# Patient Record
Sex: Female | Born: 1985 | Hispanic: Yes | Marital: Single | Smoking: Never smoker
Health system: Southern US, Community
[De-identification: ages and names within clinical notes are randomized; demographics above are authoritative.]

## PROBLEM LIST (undated history)

## (undated) ENCOUNTER — Emergency Department: Admission: EM | Payer: Self-pay

## (undated) DIAGNOSIS — D499 Neoplasm of unspecified behavior of unspecified site: Secondary | ICD-10-CM

## (undated) DIAGNOSIS — E039 Hypothyroidism, unspecified: Secondary | ICD-10-CM

## (undated) DIAGNOSIS — E079 Disorder of thyroid, unspecified: Secondary | ICD-10-CM

## (undated) HISTORY — DX: Disorder of thyroid, unspecified: E07.9

## (undated) HISTORY — DX: Neoplasm of unspecified behavior of unspecified site: D49.9

---

## 2019-03-22 ENCOUNTER — Ambulatory Visit (LOCAL_COMMUNITY_HEALTH_CENTER): Payer: Self-pay

## 2019-03-22 ENCOUNTER — Other Ambulatory Visit: Payer: Self-pay

## 2019-03-22 VITALS — BP 115/78 | Ht 61.02 in | Wt 134.0 lb

## 2019-03-22 DIAGNOSIS — Z3201 Encounter for pregnancy test, result positive: Secondary | ICD-10-CM

## 2019-03-22 LAB — PREGNANCY, URINE: Preg Test, Ur: POSITIVE — AB

## 2019-03-22 NOTE — Progress Notes (Signed)
No [facility]. Already had PNV. Pt unsure of where she wants to go for prenatal care; declines preadmit at this time. Pt to check with her prescribing Dr about thyroid medicine.

## 2019-03-28 ENCOUNTER — Emergency Department
Admission: EM | Admit: 2019-03-28 | Discharge: 2019-03-28 | Disposition: A | Discharge status: Home / Self Care | Payer: Self-pay | Attending: Student | Admitting: Student

## 2019-03-28 ENCOUNTER — Emergency Department: Payer: Self-pay

## 2019-03-28 ENCOUNTER — Other Ambulatory Visit: Payer: Self-pay

## 2019-03-28 DIAGNOSIS — N898 Other specified noninflammatory disorders of vagina: Secondary | ICD-10-CM | POA: Insufficient documentation

## 2019-03-28 DIAGNOSIS — O26891 Other specified pregnancy related conditions, first trimester: Secondary | ICD-10-CM | POA: Insufficient documentation

## 2019-03-28 DIAGNOSIS — Z79899 Other long term (current) drug therapy: Secondary | ICD-10-CM | POA: Insufficient documentation

## 2019-03-28 DIAGNOSIS — Z3A01 Less than 8 weeks gestation of pregnancy: Secondary | ICD-10-CM | POA: Insufficient documentation

## 2019-03-28 LAB — CBC WITH DIFFERENTIAL/PLATELET
Abs Immature Granulocytes: 0.02 10*3/uL (ref 0.00–0.07)
Basophils Absolute: 0 10*3/uL (ref 0.0–0.1)
Basophils Relative: 0 %
Eosinophils Absolute: 0.1 10*3/uL (ref 0.0–0.5)
Eosinophils Relative: 2 %
HCT: 35.6 % — ABNORMAL LOW (ref 36.0–46.0)
Hemoglobin: 12.4 g/dL (ref 12.0–15.0)
Immature Granulocytes: 0 %
Lymphocytes Relative: 32 %
Lymphs Abs: 2.2 10*3/uL (ref 0.7–4.0)
MCH: 30.9 pg (ref 26.0–34.0)
MCHC: 34.8 g/dL (ref 30.0–36.0)
MCV: 88.8 fL (ref 80.0–100.0)
Monocytes Absolute: 0.4 10*3/uL (ref 0.1–1.0)
Monocytes Relative: 6 %
Neutro Abs: 4 10*3/uL (ref 1.7–7.7)
Neutrophils Relative %: 60 %
Platelets: 239 10*3/uL (ref 150–400)
RBC: 4.01 MIL/uL (ref 3.87–5.11)
RDW: 11.5 % (ref 11.5–15.5)
WBC: 6.8 10*3/uL (ref 4.0–10.5)
nRBC: 0 % (ref 0.0–0.2)

## 2019-03-28 LAB — HCG, QUANTITATIVE, PREGNANCY: hCG, Beta Chain, Quant, S: 16151 m[IU]/mL — ABNORMAL HIGH (ref <5)

## 2019-03-28 LAB — COMPREHENSIVE METABOLIC PANEL
ALT: 14 U/L (ref 0–44)
AST: 18 U/L (ref 15–41)
Albumin: 4.4 g/dL (ref 3.5–5.0)
Alkaline Phosphatase: 46 U/L (ref 38–126)
Anion gap: 7 (ref 5–15)
BUN: 7 mg/dL (ref 6–20)
CO2: 24 mmol/L (ref 22–32)
Calcium: 9.4 mg/dL (ref 8.9–10.3)
Chloride: 106 mmol/L (ref 98–111)
Creatinine, Ser: 0.55 mg/dL (ref 0.44–1.00)
GFR calc Af Amer: >60 mL/min (ref >60)
GFR calc non Af Amer: >60 mL/min (ref >60)
Glucose, Bld: 117 mg/dL — ABNORMAL HIGH (ref 70–99)
Potassium: 3.4 mmol/L — ABNORMAL LOW (ref 3.5–5.1)
Sodium: 137 mmol/L (ref 135–145)
Total Bilirubin: 0.7 mg/dL (ref 0.3–1.2)
Total Protein: 7.9 g/dL (ref 6.5–8.1)

## 2019-03-28 LAB — URINALYSIS, COMPLETE (UACMP) WITH MICROSCOPIC
Bacteria, UA: NONE SEEN
Bilirubin Urine: NEGATIVE
Glucose, UA: NEGATIVE mg/dL
Hgb urine dipstick: NEGATIVE
Ketones, ur: NEGATIVE mg/dL
Leukocytes,Ua: NEGATIVE
Nitrite: NEGATIVE
Protein, ur: NEGATIVE mg/dL
Specific Gravity, Urine: 1.002 — ABNORMAL LOW (ref 1.005–1.030)
pH: 7 (ref 5.0–8.0)

## 2019-03-28 LAB — WET PREP, GENITAL
Clue Cells Wet Prep HPF POC: NONE SEEN
Sperm: NONE SEEN
Trich, Wet Prep: NONE SEEN
Yeast Wet Prep HPF POC: NONE SEEN

## 2019-03-28 LAB — TYPE AND SCREEN
ABO/RH(D): A POS
Antibody Screen: NEGATIVE

## 2019-03-28 NOTE — ED Notes (Signed)
Paper copy of discharge papers signed

## 2019-03-28 NOTE — ED Notes (Signed)
ED Provider at bedside. 

## 2019-03-28 NOTE — ED Triage Notes (Signed)
Pt states she is about 2 months pregnant and for the past 7 days had a odorless brown vaginal discharge. Denies and pain.

## 2019-03-28 NOTE — ED Provider Notes (Signed)
[facility] Emergency Department Provider Note  ____________________________________________   First MD Initiated Contact with Patient 03/28/19 1043     (approximate)  I have reviewed the triage vital signs and the nursing notes.  History  Chief Complaint Vaginal Bleeding    HPI Linda Dorsey is a 33 y.o. female with history of microprolactinoma, hypothyroidism, G1P0 at [redacted]w[redacted]d by LMP (02/01/19) who presents to the emergency department for vaginal discharge.  Patient reports over the last 8 days she has had a very small amount of brownish vaginal discharge.  She only really notices this when wiping.  She denies any bright red bleeding or blood clots.  She reports minimal, if any lower abdominal discomfort, not even to the severity of menstrual cramps.  She reports some burning urination over the same time period as well.  She has not had an ultrasound this pregnancy thus far.  This is a planned pregnancy.   Past Medical Hx Past Medical History:  Diagnosis Date  . Thyroid disease    hypothyroid  . Tumor    brain tumor, noncancerous    Problem List There are no active problems to display for this patient.   Past Surgical Hx History reviewed. No pertinent surgical history.  Medications Prior to Admission medications   Medication Sig Start Date End Date Taking? Authorizing Provider  levothyroxine (SYNTHROID) 25 MCG tablet Take 25 mcg by mouth daily before breakfast. Correct name for medication is Eutirox (Levotiroxina sodica 25 mcg) - received medicine in [place]    [provider]  Prenatal Vit-Fe Fumarate-FA (PRENATAL VITAMINS PO) Take 2 tablets by mouth. 2 gummies per day    [provider]    Allergies Patient has no known allergies.  Family Hx No family history on file.  Social Hx Social History   Tobacco Use  . Smoking status: Never Smoker  . Smokeless tobacco: Never Used  Substance Use Topics  . Alcohol use: Not  Currently  . Drug use: Not Currently     Review of Systems  Constitutional: Negative for fever, chills. Eyes: Negative for visual changes. ENT: Negative for sore throat. Cardiovascular: Negative for chest pain. Respiratory: Negative for shortness of breath. Gastrointestinal: Negative for nausea, vomiting.  Genitourinary: + for dysuria, vaginal discharge. Musculoskeletal: Negative for leg swelling. Skin: Negative for rash. Neurological: Negative for for headaches.   Physical Exam  Vital Signs: ED Triage Vitals  Enc Vitals Group     BP 03/28/19 1019 (!) 108/54     Pulse Rate 03/28/19 1019 94     Resp 03/28/19 1019 16     Temp 03/28/19 1019 98.5 F (36.9 C)     Temp Source 03/28/19 1019 Oral     SpO2 03/28/19 1019 97 %     Weight --      Height --      Head Circumference --      Peak Flow --      Pain Score 03/28/19 1024 0     Pain Loc --      Pain Edu? --      Excl. in [place]? --     Constitutional: Alert and oriented.  Head: Normocephalic. Atraumatic. Eyes: Conjunctivae clear. Sclera anicteric. Nose: No congestion. No rhinorrhea. Mouth/Throat: Wearing a mask. Neck: No stridor.   Cardiovascular: Normal rate, regular rhythm. Extremities well perfused. Respiratory: Normal respiratory effort.  Lungs CTAB. Gastrointestinal: Soft. Non-tender. Non-distended.  Pelvic: RN chaperone present, translator assisted. Normal external genitalia. Small amount of brown  mucus discharge in vaginal canal. No BRB or active bleeding. No CMT or adnexal tenderness.  Musculoskeletal: No lower extremity edema. No deformities. Neurologic:  Normal speech and language. No gross focal neurologic deficits are appreciated.  Skin: Skin is warm, dry and intact. No rash noted. Psychiatric: Mood and affect are appropriate for situation.  EKG  N/A    Radiology  [place]:  IMPRESSION:  Single live intrauterine gestation measuring 6 weeks 4 days by crown-rump length.    Procedures  Procedure(s)  performed (including critical care):  Procedures   Initial Impression / Assessment and Plan / ED Course  33 y.o. female G1P0 at 7 weeks, 6 days by LMP who presents to the ED for scant brown discharge as well as dysuria.  Ddx: UTI, pelvic infection, miscarriage, threatened miscarriage  Plan: labs, pelvic exam w/ swabs, [place]  US reveals single live IUP. Wet prep and UA negative. She is A+ and does not require Rhogam. Discussed results with patient and partner w/ assistance of interpreter. Advised close OB follow up, continued PNV, and return precautions. They voice understanding and are comfortable w/ the plan and discharge.   Final Clinical Impression(s) / ED Diagnosis  Final diagnoses:  Vaginal discharge during pregnancy in first trimester       Note:  This document was prepared using Dragon voice recognition software and may include unintentional dictation errors.   Lilia Pro., MD 03/28/19 [id number]

## 2019-03-28 NOTE — ED Notes (Signed)
Spanish interpreter used for discharge

## 2019-03-28 NOTE — ED Notes (Signed)
Patient transported to Ultrasound 

## 2019-03-28 NOTE — Discharge Instructions (Addendum)
Thank you for letting us take care of you in the emergency department today.   Please continue to take any regular, prescribed medications including your prenatal vitamins.   Please follow up with: - An OB/GYN doctor to review your ER visit and follow up on your symptoms.   Please return to the ER for any new or worsening symptoms, such as increased bleeding, bright red bleeding, or worsening abdominal pain.

## 2019-03-28 NOTE — ED Notes (Signed)
Pt reports that she is approximately [redacted] weeks pregnant. Pt states that she is having brown vaginal discharge. Pt denies any bright red blood, pt states that she is having very mild pain in her abdomen, reports that it is less than period cramps. Pt reports some burning with urination. Pt is G1P0.

## 2019-04-06 ENCOUNTER — Ambulatory Visit (INDEPENDENT_AMBULATORY_CARE_PROVIDER_SITE_OTHER): Payer: Self-pay | Admitting: Obstetrics and Gynecology

## 2019-04-06 ENCOUNTER — Other Ambulatory Visit: Payer: Self-pay

## 2019-04-06 ENCOUNTER — Encounter: Payer: Self-pay | Admitting: Obstetrics and Gynecology

## 2019-04-06 VITALS — BP 103/68 | HR 77 | Ht 61.02 in | Wt 136.1 lb

## 2019-04-06 DIAGNOSIS — O209 Hemorrhage in early pregnancy, unspecified: Secondary | ICD-10-CM

## 2019-04-06 NOTE — Progress Notes (Signed)
Patient comes in today for ED follow up for bleeding in pregnancy. She is still having the bleeding. Mild pain in abdominal area.

## 2019-04-06 NOTE — Progress Notes (Signed)
HPI:      Ms. [address] Linda Dorsey is a 33 y.o. No obstetric history on file. who LMP was No LMP recorded.  Subjective:   She presents today after being seen in the emergency department 9 days ago for bleeding in the first trimester pregnancy.  An ultrasound performed at that time revealed a live intrauterine pregnancy at 6 weeks and 4 days with positive fetal heart tones.  Since that time the patient states that her bleeding has continued.  She notices it only when wiping after urination.  She is certain it is coming from the vagina.  She denies cramping or any heavy bleeding. She is currently taking prenatal vitamins. Her blood type is positive. This is her first pregnancy.    Hx: The following portions of the patient's history were reviewed and updated as appropriate:             She  has no past medical history on file. She does not have a problem list on file. She  has no past surgical history on file. Her family history is not on file. She  reports that she has never smoked. She has never used smokeless tobacco. She reports previous drug use. No history on file for alcohol. She has a current medication list which includes the following prescription(s): levothyroxine. She has no allergies on file.       Review of Systems:  Review of Systems  Constitutional: Denied constitutional symptoms, night sweats, recent illness, fatigue, fever, insomnia and weight loss.  Eyes: Denied eye symptoms, eye pain, photophobia, vision change and visual disturbance.  Ears/Nose/Throat/Neck: Denied ear, nose, throat or neck symptoms, hearing loss, nasal discharge, sinus congestion and sore throat.  Cardiovascular: Denied cardiovascular symptoms, arrhythmia, chest pain/pressure, edema, exercise intolerance, orthopnea and palpitations.  Respiratory: Denied pulmonary symptoms, asthma, pleuritic pain, productive sputum, cough, dyspnea and wheezing.  Gastrointestinal: Denied, gastro-esophageal reflux,  melena, nausea and vomiting.  Genitourinary: See HPI for additional information.  Musculoskeletal: Denied musculoskeletal symptoms, stiffness, swelling, muscle weakness and myalgia.  Dermatologic: Denied dermatology symptoms, rash and scar.  Neurologic: Denied neurology symptoms, dizziness, headache, neck pain and syncope.  Psychiatric: Denied psychiatric symptoms, anxiety and depression.  Endocrine: Denied endocrine symptoms including hot flashes and night sweats.   Meds:   Current Outpatient Medications on File Prior to Visit  Medication Sig Dispense Refill  . levothyroxine (SYNTHROID) 25 MCG tablet Take 25 mcg by mouth daily before breakfast.     No current facility-administered medications on file prior to visit.     Objective:     Vitals:   04/06/19 0859  BP: 103/68  Pulse: 77                Assessment:    No obstetric history on file. There are no active problems to display for this patient.    1. Bleeding in early pregnancy     With nonviable intrauterine pregnancy based on ultrasound 9 days ago.   Plan:            1.  Repeat ultrasound this week.   2.  SAb I have discussed the possibility of miscarriage with the patient.  I have informed her that vaginal bleeding, cramping or passage of tissue are the most common signs of miscarriage.  Should she have heavy bleeding, pass tissue or have other problems, she has been informed to call the office immediately.  I have advised her to remain at pelvic rest for at least one week  after her last episode of bleeding.  If she is currently working, I have instructed her to discontinue until this situation resolves.  Complete versus incomplete miscarriage was discussed and the patient is aware that should she develop heavy bleeding, fever, or persistent crampy pelvic pain she may require a D & E to remove the remaining portion of the miscarried pregnancy.  I advised her to keep me informed should her condition change. Based on  a viable intrauterine pregnancy and a very small amount of bleeding being discussed I believe she has an excellent chance of continuing the pregnancy without first trimester miscarriage.  Orders Orders Placed This Encounter  Procedures  . US OB Comp Less 14 Wks    No orders of the defined types were placed in this encounter.     F/U  Return in about 1 week (around 04/13/2019). I spent 23 minutes involved in the care of this patient of which greater than 50% was spent discussing bleeding in pregnancy, miscarriage, signs and symptoms of miscarriage, ultrasound follow-up, general prenatal care.  All questions answered.  Finis Bud, M.D. 04/06/2019 10:11 AM

## 2019-04-08 ENCOUNTER — Ambulatory Visit: Payer: Self-pay

## 2019-04-08 DIAGNOSIS — O209 Hemorrhage in early pregnancy, unspecified: Secondary | ICD-10-CM

## 2019-04-08 DIAGNOSIS — Z3A01 Less than 8 weeks gestation of pregnancy: Secondary | ICD-10-CM

## 2019-04-09 ENCOUNTER — Encounter: Payer: Self-pay | Admitting: Obstetrics and Gynecology

## 2019-04-09 ENCOUNTER — Ambulatory Visit (INDEPENDENT_AMBULATORY_CARE_PROVIDER_SITE_OTHER): Payer: Self-pay | Admitting: Obstetrics and Gynecology

## 2019-04-09 ENCOUNTER — Other Ambulatory Visit: Payer: Self-pay

## 2019-04-09 VITALS — BP 97/67 | HR 77 | Ht 61.02 in | Wt 136.7 lb

## 2019-04-09 DIAGNOSIS — O021 Missed abortion: Secondary | ICD-10-CM

## 2019-04-09 NOTE — Progress Notes (Signed)
GYNECOLOGY PROGRESS NOTE  Subjective:    Patient ID: Linda Dorsey, female    DOB: 13-Aug-1985, 33 y.o.   MRN: [id number]  Spanish interpreter present for today's visit.   HPI  Patient is a 33 y.o. G51P0000 female who presents for follow up after ultrasound.  Patient is at [redacted]w[redacted]d by 6 week ER ultrasound performed 03/28/2019. Patient reports vaginal bleeding for the past week, alternating between very light flow and spotting when wiping.  No associated cramping or passage of clots or tissue.  Presented today for f/u ultrasound with bleeding which now notes no fetal heart tones (previously documented FHT on 141 bpm on  ER ultrasound).    The following portions of the patient's history were reviewed and updated as appropriate: allergies, current medications, past family history, past medical history, past social history, past surgical history and problem list.    Review of Systems Pertinent items noted in HPI and remainder of comprehensive ROS otherwise negative.   Objective:   Blood pressure 97/67, pulse 77, height 5' 1.02" (1.55 m), weight 136 lb 11.2 oz (62 kg), last menstrual period 02/01/2019. General appearance: alert and no distress Remainder of exam deferred.     Labs:  Lab Results  Component Value Date   WBC 6.8 03/28/2019   HGB 12.4 03/28/2019   HCT 35.6 (L) 03/28/2019   MCV 88.8 03/28/2019   PLT 239 03/28/2019    Results for [facility] KALA, PILAT (MRN [id number]) as of 04/09/2019 08:16  Ref. Range 03/28/2019 13:27  Antibody Screen Unknown NEG  ABO/RH(D) Unknown A POS    Results for JACKSYN, HILLESTAD (MRN [id number]) as of 04/09/2019 08:16  Ref. Range 03/28/2019 10:47  HCG, Beta Chain, Quant, S Latest Ref Range: <5 mIU/mL 16,151 (H)    Imaging:  [facility] ER Ultrasound 03/28/2019: CLINICAL DATA:  Pregnancy.  Vaginal discharge  EXAM: OBSTETRIC <14 WK ULTRASOUND  TECHNIQUE: Transabdominal ultrasound was performed for evaluation of the  gestation as well as the maternal uterus and adnexal regions.  COMPARISON:  None.  FINDINGS: Intrauterine gestational sac: Single  Yolk sac:  Visualized.  Embryo:  Visualized.  Cardiac Activity: Visualized.  Heart Rate: 141 bpm  CRL: 7.4 mm 6 w 4 d                  [place] EDC: 11/17/2019  Subchorionic hemorrhage:  None visualized.  Maternal uterus/adnexae: Unremarkable.  IMPRESSION: Single live intrauterine gestation measuring 6 weeks 4 days by crown-rump length.   Electronically Signed   By: Davina Poke M.D.   On: 03/28/2019 13:12   US OB Comp Less 14 Wks Patient Name: Linda Dorsey DOB: [birth year] MRN: [id number] ULTRASOUND REPORT  Location: [facility] Date of Service: 04/08/2019   TECHNIQUE: Both transabdominal and transvaginal ultrasound examinations of the pelvis were performed. Transabdominal technique was performed for global imaging of the pelvis including uterus, ovaries, adnexal regions, and pelvic cul-de-sac. It was necessary to proceed with endovaginal exam following the transabdominal exam to visualize the endometrium and adnexa.  Indications:dating Findings:  [name] intrauterine pregnancy is visualized with a CRL consistent with  [redacted]w[redacted]d gestation, giving an (U/S) EDD of 11/20/2019. . FHR: negative -  BPM  CRL measurement: 18.1 mm Yolk sac is visualized and appears normal and early anatomy is normal. Amnion: not visualized   Right Ovary is normal in appearance. Left Ovary is normal appearance. Corpus luteal cyst:  is not visualized Survey of the adnexa demonstrates no adnexal masses.  There is no free peritoneal fluid in the cul de sac.  Impression: 1. [redacted]w[redacted]d Non-Viable Singleton Intrauterine pregnancy by U/S. 2. (U/S) EDD is 11/20/2019. 3. No fetal heart rate seen at this time.  Recommendations: 1.Clinical correlation with the patient's History and Physical Exam.  Jenine M. Albertine Grates     RDMS  The  ultrasound images and findings were reviewed by me and I agree with  the above report.  Finis Bud, M.D. 04/08/2019 3:44 PM   Assessment:   Missed abortion  Plan:   - Discussed management of missed abortion: expectant management vs misoprostol vs D&E.  Risks and benefits of all modalities discussed; all questions answered.  Patient opted for expectant management for now, desires to think over all options. Will return on Tuesday to further discuss options.    A total of 15 minutes were spent face-to-face with the patient during this encounter and over half of that time dealt with counseling and coordination of care.   Rubie Maid, MD [facility]

## 2019-04-09 NOTE — Patient Instructions (Addendum)
Aborto espontneo Miscarriage El aborto espontneo es la prdida de un beb que no ha nacido (feto) antes de la [id number] del Media planner. Siga estas indicaciones en su casa: Medicamentos   [employer] de venta libre y los recetados solamente como se lo haya indicado el mdico.  Si le recetaron un antibitico, tmelo como se lo haya indicado el mdico. No deje de tomar los antibiticos aunque comience a Sports administrator.  No tome antiinflamatorios no esteroideos (AINE), a menos que el [employer] diga que son seguros para usted. Estos incluyen aspirina e ibuprofeno. Estos medicamentos pueden provocarle sangrado. Actividad  Haga reposo segn lo indicado. Pregntele al mdico qu actividades son seguras para usted.  Pida ayuda para Optometrist las tareas de la casa durante [place]. Instrucciones generales  Anote cuntos apsitos [place] por da y cun saturados estn.  Observe la cantidad de tejido o grumos de sangre (cogulos de sangre) que expulsa por la vagina. Guarde las cantidades grandes de tejido para llevrselas al mdico.  No use tampones, no se haga duchas vaginales ni tenga relaciones sexuales hasta que el mdico la autorice.  Para que usted y su pareja puedan sobrellevar el proceso de duelo, hable con su mdico o busque apoyo psicolgico.  Cuando est lista, acuda al mdico para hablar [employer] pasos que debe seguir para cuidar su salud. Adems, hable con su mdico [employer] que debe adoptar para tener un embarazo saludable en el futuro.  Concurra a todas las visitas de seguimiento como se lo haya indicado el mdico. Esto es importante. Comunquese con un mdico si:  Tiene fiebre o siente escalofros.  Tiene una secrecin vaginal con mal olor.  Aumenta el sangrado. Solicite ayuda de inmediato si:  Tiene espasmos o dolor muy intensos en el abdomen o en la espalda.  Elimina grumos de sangre por la vagina, que tienen el tamao de una nuez o ms.  Elimina  tejido por la vagina, que tiene el tamao de una nuez o ms.  Empapa ms de un apsito de tamao normal por hora.  Se siente dbil o mareada.  Pierde el conocimiento (se desmaya).  Siente tristeza que no se va o piensa en lastimarse. Resumen  El aborto espontneo es la prdida de un beb que no ha nacido antes de la [id number] del [place].  Siga las indicaciones de su mdico para el cuidado Systems developer. Concurra a todas las visitas de control.  Para que usted y su pareja puedan sobrellevar el proceso de duelo, hable con su mdico o busque apoyo psicolgico. Esta informacin no tiene Marine scientist el consejo del mdico. Asegrese de hacerle al mdico cualquier pregunta que tenga. Document Released: 11/26/2011 Document Revised: 03/03/2017 Document Reviewed: 03/03/2017 Elsevier Patient Education  [place] sobrellevar la prdida de Nutritional therapist Managing Pregnancy Loss La prdida del Media planner puede ocurrir en cualquier momento durante un Media planner. Generalmente no se conoce la causa. Rara vez se debe a algo que usted hizo. La prdida del Media planner a comienzos del Media planner (Forensic scientist trimestre) se denomina aborto espontneo. Este es el tipo de prdida de un embarazo ms frecuente. La prdida del embarazo que ocurre despus de las 20 semanas de embarazo se denomina muerte fetal si el corazn del beb deja de latir antes del nacimiento. La muerte fetal es mucho menos frecuente. Algunas mujeres experimentan trabajo de parto espontneo poco despus de la muerte fetal, lo que ocasiona el nacimiento del beb muerto. Cualquier prdida  de un embarazo puede ser devastadora. Tendr que recuperarse fsica y emocionalmente. La mayora de las mujeres son capaces de quedar embarazadas nuevamente despus de la prdida de un Media planner y dar a luz un beb sano. Cmo manejar la recuperacin emocional  La prdida de un embarazo es muy difcil emocionalmente. Es posible que sienta muchas  emociones distintas mientras hace el duelo. Puede sentirse triste y [place]. Tambin puede sentir culpa. Es normal tener perodos de llanto. La recuperacin emocional puede llevar ms tiempo que la recuperacin fsica. Es diferente para Higher education careers adviser. Estas medidas pueden ayudarla a sobrellevar esta prdida:  Recuerde que es poco probable que haya hecho algo para causar la prdida del Media planner.  Comparta sus pensamientos y sentimientos con su pareja, familiares y Biomedical engineer. Recuerde que su pareja tambin se est recuperando emocionalmente.  Asegrese de tener un buen sistema de apoyo. No pase demasiado tiempo sola.  Renase con un consejero sobre prdida de [place] o nase a un grupo de apoyo para prdida de Building control surveyor.  Duerma lo suficiente y siga una dieta sana. Regrese a la actividad fsica con regularidad cuando se haya recuperado fsicamente.  No consuma drogas ni alcohol para controlar sus emociones.  Considere consultar a un profesional de salud mental para que la ayude a Chief Strategy Officer.  Pdale a un amigo o un ser querido que la ayude a decidir qu hacer con la ropa y los artculos infantiles que recibi para el beb. En el caso de muerte fetal, muchas mujeres se benefician al tomar [employer] en el proceso del duelo. Es recomendable que:  Sostenga al beb despus del nacimiento.  Le ponga un nombre al beb.  Solicite un certificado de nacimiento.  Cree un recuerdo, [place] los pies.  Vista al beb y pida que le tomen una fotografa.  Haga arreglos para Furniture conservator/restorer.  Solicite un bautismo o una bendicin. Los hospitales tienen personal que puede ayudarla a Programme researcher, broadcasting/film/video todo esto. Cmo reconocer el estrs emocional Es normal tener estrs emocional despus de perder un Media planner. Pero el estrs emocional que dura mucho tiempo o se vuelve grave requiere tratamiento. Tenga cuidado con estos signos de estrs emocional grave:  Tristeza,  ira o culpa, que no desaparecen y estn interfiriendo en sus actividades normales.  Problemas de relacin que hayan surgido o empeorado desde la prdida del Media planner.  Signos de depresin que duran ms de 2 semanas. Pueden incluir: ? Tristeza. ? Ansiedad. ? Desesperanza. ? Falta de inters en las actividades que disfruta. ? Incapacidad para concentrarse. ? Dificultad para dormir o dormir demasiado. ? Prdida del apetito o comer en exceso. ? Pensamientos sobre la muerte o de [place] dao a s misma. Siga estas instrucciones en su casa:  Use los medicamentos de venta libre y los recetados solamente como se lo haya indicado el mdico.  Haga reposo en su casa hasta que su nivel de energa regrese. Reanude sus actividades normales segn lo indicado por el mdico. Pregntele al mdico qu actividades son seguras para usted.  Cuando est lista, visite a su mdico para hablar [employer] debe tomar para futuros embarazos.  Concurra a todas las visitas de seguimiento como se lo haya indicado el mdico. Esto es importante. Dnde encontrar 37  Para que usted y su pareja puedan sobrellevar el proceso del duelo, hable con su mdico o busque apoyo psicolgico.  Considere la posibilidad de reunirse con otras mujeres que hayan sufrido la prdida de Nutritional therapist.  Consulte al [employer] distintos grupos de 5 y recursos. Dnde buscar ms informacin  U.S. Department of Health and [employer], Office on Women's Health ([place] y [place], [place] de la Mujer): [place]Signs.tn  American Pregnancy Association (Asociacin Americana del [place]): CulturalLocator.com.cy. Comunquese con un mdico si:  Contina sintiendo pena, tristeza o falta de motivacin para realizar las actividades diarias, y estos sentimientos no mejoran con Physiological scientist.  Tiene problemas para recuperarse emocionalmente, en especial si est consumiendo  alcohol o sustancias para ayudar. Solicite ayuda inmediatamente si:  Piensa en lastimarse a usted misma o a Producer, television/film/video. Si alguna vez siente que puede lastimarse a usted misma o Market researcher a Producer, television/film/video, o tiene pensamientos de poner fin a su vida, busque ayuda de inmediato. Puede dirigirse al servicio de emergencias ms cercano o comunicarse con:  El servicio de emergencias de su localidad (911 en EE.UU.).  Una lnea de asistencia al suicida y Freight forwarder en crisis, como National Suicide Prevention Lifeline ([place]), al 8147491772. Est disponible las 24 horas del da. Resumen  Cualquier prdida de Nutritional therapist puede ser difcil fsica y emocionalmente.  Es posible que tenga muchas emociones distintas mientras hace el duelo. La recuperacin emocional puede durar ms tiempo que la recuperacin fsica.  Es normal tener estrs emocional despus de perder Nutritional therapist. Pero el estrs emocional que dura mucho tiempo o se vuelve grave requiere tratamiento.  Consulte a su mdico si tiene problemas emocionales despus de la prdida de Nutritional therapist. Esta informacin no tiene Marine scientist el consejo del mdico. Asegrese de hacerle al mdico cualquier pregunta que tenga. Document Released: 09/06/2017 Document Revised: 09/21/2018 Document Reviewed: 09/06/2017 Elsevier Patient Education  2020 [employer].

## 2019-04-09 NOTE — Progress Notes (Signed)
Pt present for u/s results. Pt stated that she is still bleeding no other problems.

## 2019-04-13 ENCOUNTER — Encounter: Payer: Self-pay | Admitting: Obstetrics and Gynecology

## 2019-07-28 LAB — GC/CHLAMYDIA PROBE AMP
Chlamydia trachomatis, NAA: NEGATIVE
Neisseria Gonorrhoeae by PCR: NEGATIVE

## 2020-02-09 LAB — OB RESULTS CONSOLE HEPATITIS B SURFACE ANTIGEN: Hepatitis B Surface Ag: NEGATIVE

## 2020-02-09 LAB — OB RESULTS CONSOLE RPR: RPR: NONREACTIVE

## 2020-02-09 LAB — OB RESULTS CONSOLE GC/CHLAMYDIA
Chlamydia: NEGATIVE
Gonorrhea: NEGATIVE

## 2020-02-09 LAB — OB RESULTS CONSOLE VARICELLA ZOSTER ANTIBODY, IGG: Varicella: IMMUNE

## 2020-02-09 LAB — OB RESULTS CONSOLE HIV ANTIBODY (ROUTINE TESTING): HIV: NONREACTIVE

## 2020-02-09 LAB — OB RESULTS CONSOLE RUBELLA ANTIBODY, IGM: Rubella: IMMUNE

## 2020-03-06 ENCOUNTER — Other Ambulatory Visit: Payer: Self-pay | Admitting: Primary Care

## 2020-03-06 DIAGNOSIS — Z3482 Encounter for supervision of other normal pregnancy, second trimester: Secondary | ICD-10-CM

## 2020-03-21 IMAGING — US US OB COMP LESS 14 WK
1 series · 14 of 28 positions shown · non-contrast
Comparison: None.

CLINICAL DATA: Pregnancy.  Vaginal discharge

EXAM:
OBSTETRIC <14 WK ULTRASOUND
TECHNIQUE: Transabdominal ultrasound was performed for evaluation of the
gestation as well as the maternal uterus and adnexal regions.

[Series 1: us ob comp less 14 wk · 34 acquisitions, 14 frames shown]
[im 2/34]
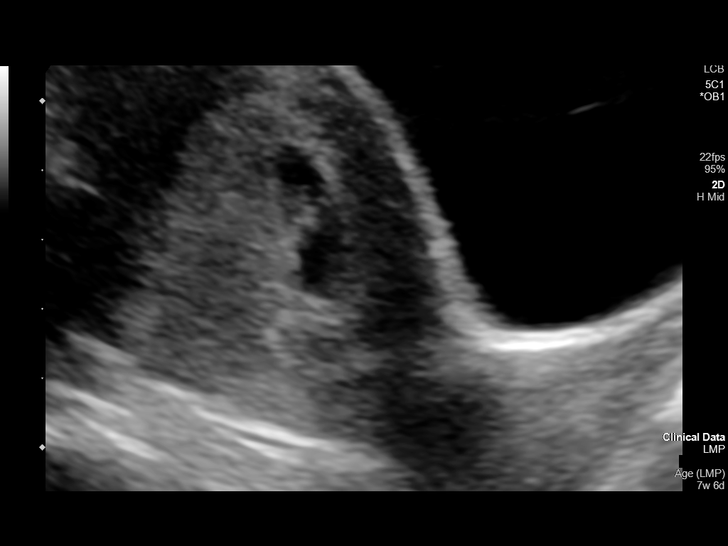
[im 4/34]
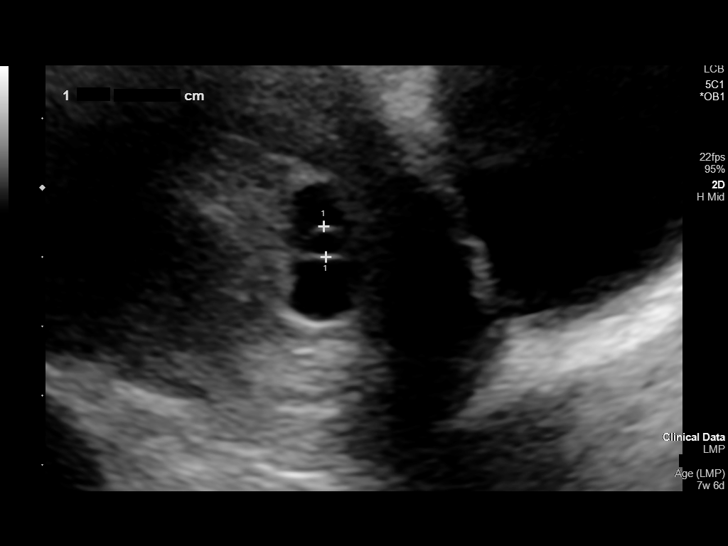
[im 7/34]
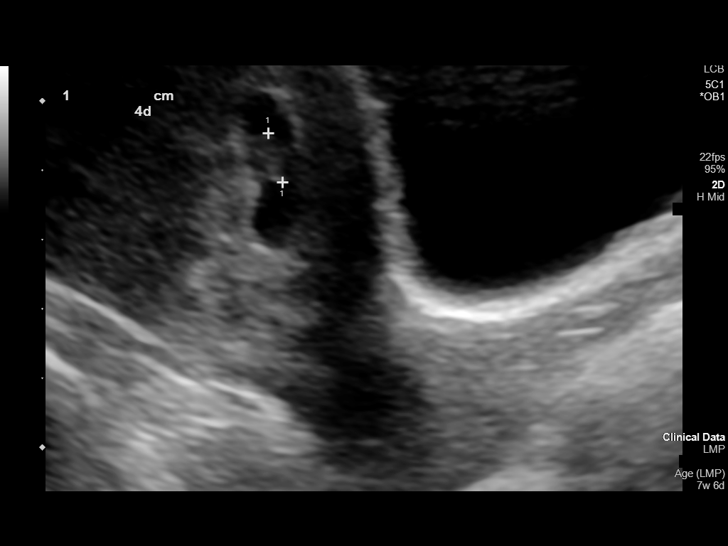
[im 9/34]
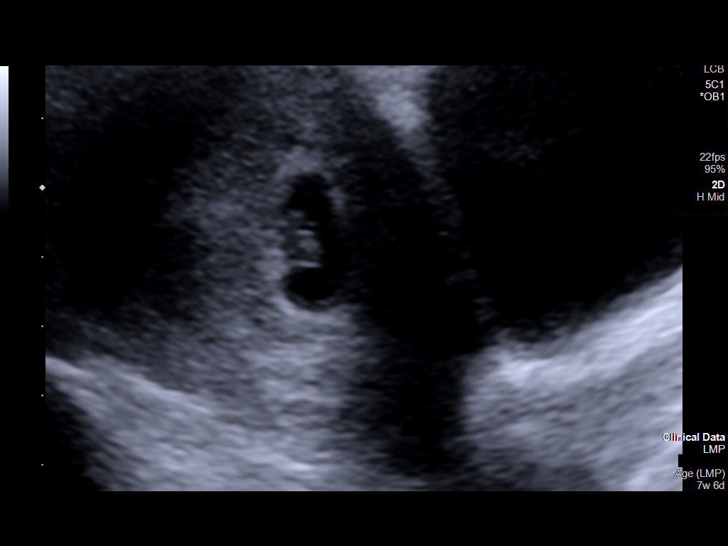
[im 12/34]
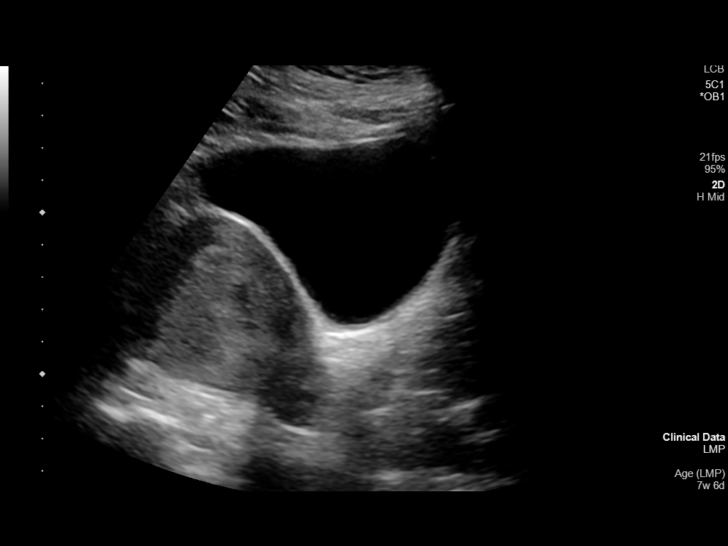
[im 14/34]
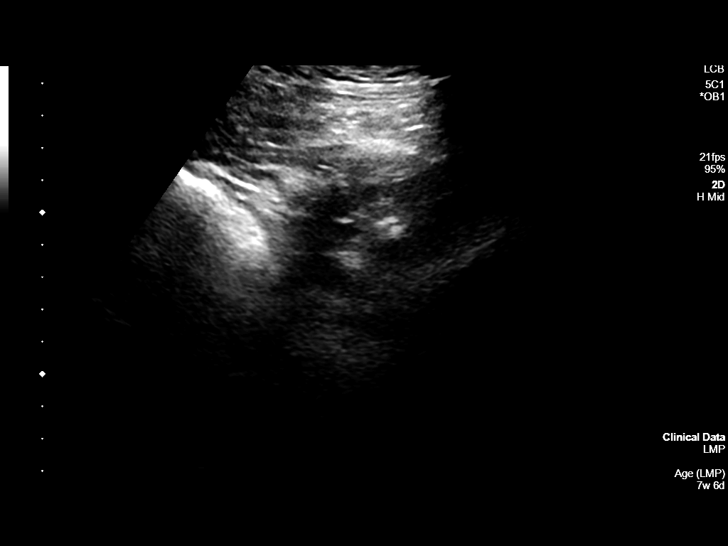
[im 16/34]
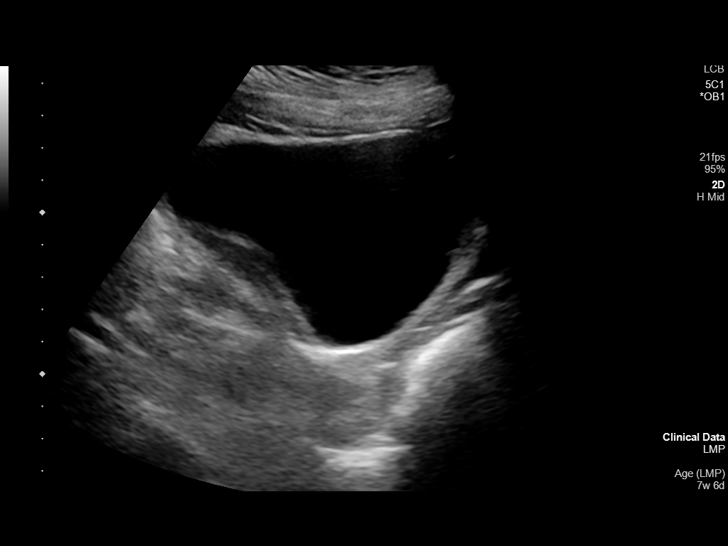
[im 19/34]
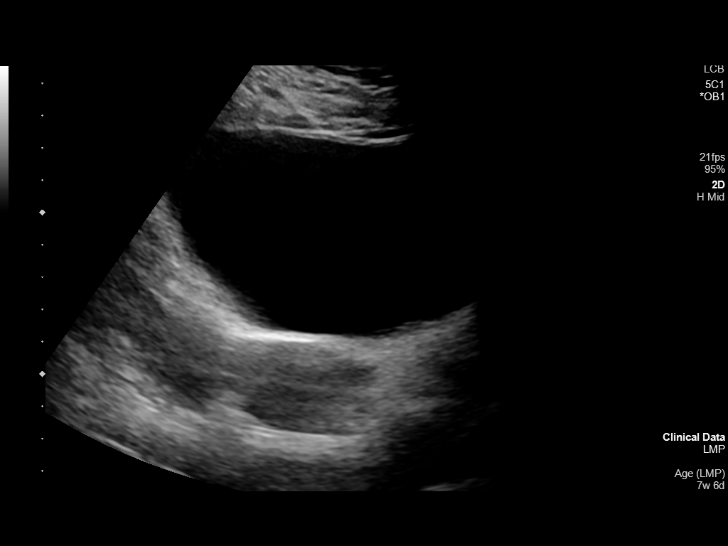
[im 21/34]
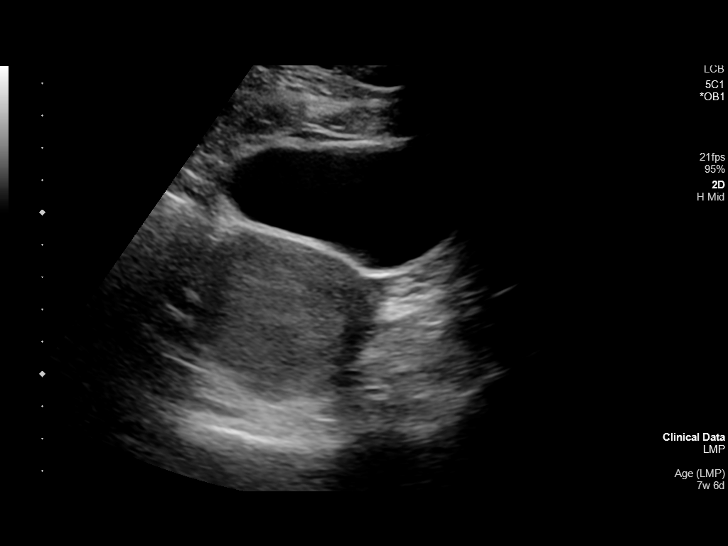
[im 24/34]
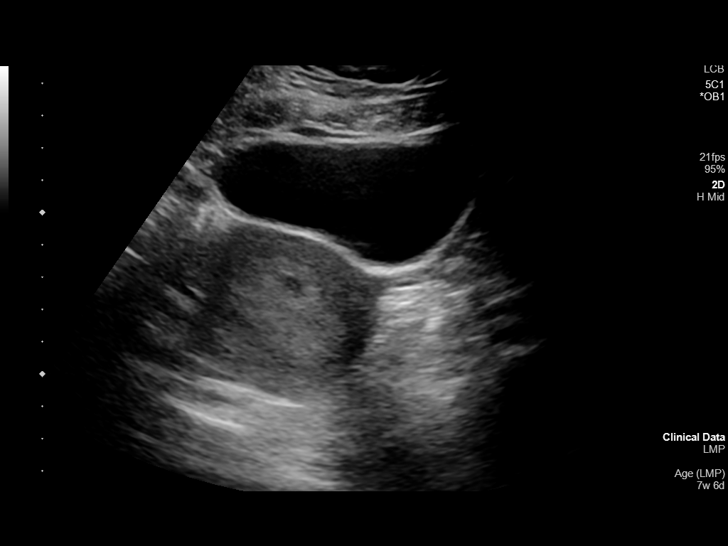
[im 26/34]
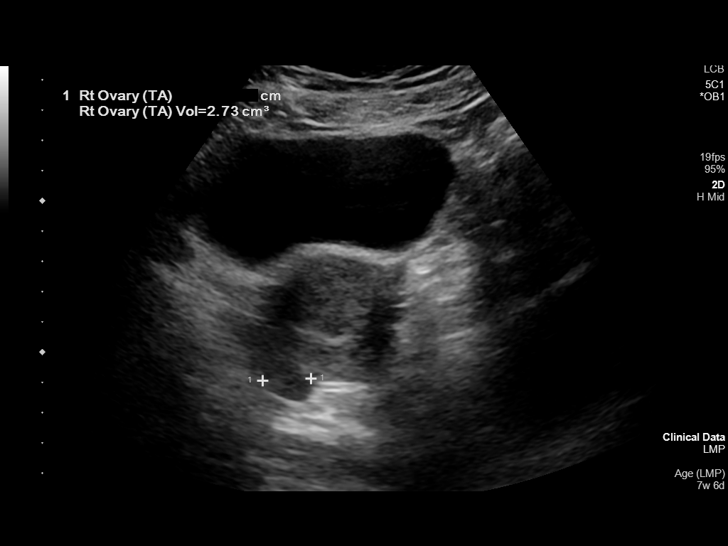
[im 29/34]
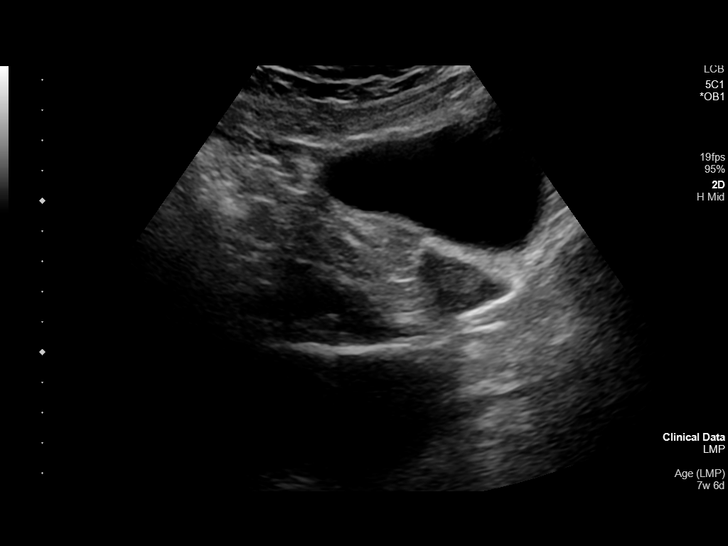
[im 31/34]
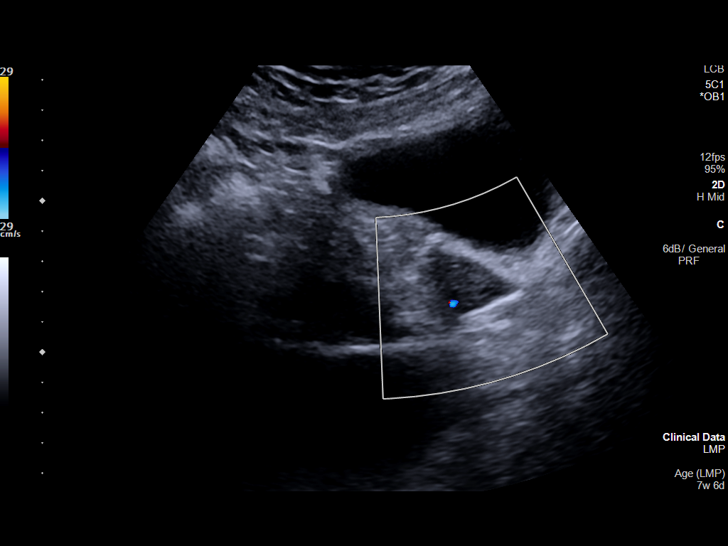
[im 34/34]
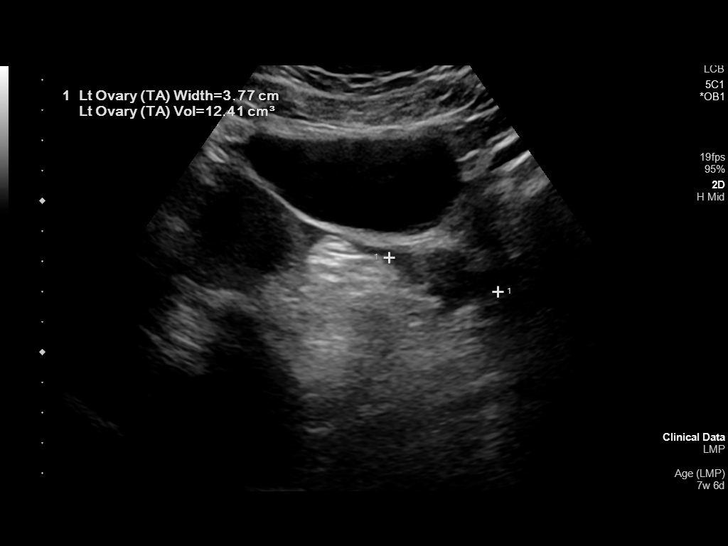

[14 of 28 positions shown; findings below may reference images not displayed]

FINDINGS: Intrauterine gestational sac: Single

Yolk sac:  Visualized.

Embryo:  Visualized.

Cardiac Activity: Visualized.

Heart Rate: 141 bpm

CRL: 7.4 mm 6 w 4 d                  US EDC: 11/17/2019

Subchorionic hemorrhage:  None visualized.

Maternal uterus/adnexae: Unremarkable.
IMPRESSION: Single live intrauterine gestation measuring 6 weeks 4 days by
crown-rump length.

## 2020-04-11 ENCOUNTER — Ambulatory Visit
Admission: RE | Admit: 2020-04-11 | Discharge: 2020-04-11 | Disposition: A | Discharge status: Home / Self Care | Payer: Self-pay | Source: Ambulatory Visit | Attending: Primary Care | Admitting: Primary Care

## 2020-04-11 ENCOUNTER — Other Ambulatory Visit: Payer: Self-pay

## 2020-04-11 DIAGNOSIS — Z3482 Encounter for supervision of other normal pregnancy, second trimester: Secondary | ICD-10-CM | POA: Insufficient documentation

## 2020-06-10 NOTE — L&D Delivery Note (Signed)
This RN removed single sponge left by Leafy Ro MD and performed vaginal scan with clear result.

## 2020-06-10 NOTE — L&D Delivery Note (Signed)
Delivery Note  [facility] Stark Jock is a G2P0010 at [redacted]w[redacted]d with an LMP of 11/16/2019, consistent with [place] [redacted]w[redacted]d.   First Stage: Labor onset: 0900 Induction: Misoprostol and oxytocin  Analgesia /Anesthesia intrapartum: Epidural SROM at 1135  Second Stage: Complete dilation at 2215 Onset of pushing at 2230 FHR second stage 140 bpm with moderate variability, variables with pushing, occasional late decelerations   Jaz presented to L&D for scheduled IOL for A1GDM.  She progressed well after 1 dose of vaginal and buccal misoprostol.  Oxytocin was initiated after no change for 4 hours.  She progressed quickly to C/C/+1 with an urge to push.  Naturi pushed well over approximately 2 hours. Delivery of a viable baby boy on 08/27/2020 at [place] by CNM Delivery of fetal head in OA position with restitution to ROT. No nuchal cord;  Anterior then posterior shoulders delivered easily with gentle downward traction. Baby placed on mom's chest, and attended to by baby RN Cord double clamped after cessation of pulsation, cut by father of baby.  Cord blood sample collected: N/A Rh pos  Third Stage: Oxytocin bolus started after delivery of infant for hemorrhage prophylaxis  Placenta delivered intact with 3 VC @ 0016 Placenta disposition: discarded Uterine tone firm / bleeding moderate  3rd laceration and bilateral sulcal laceration identified  -Dr. Leafy Ro called to delivery room for evaluation and repair of laceration Anesthesia for repair: epidural  Repair by Dr. Leafy Ro - please see progress note for further details  Est. Blood Loss (mL): [id number]  Complications: 3a perineal laceration   Mom to postpartum.  Baby to Couplet care / Skin to Skin.  Newborn: Information for the patient's newborn:  Viktorya, Arguijo [[id number]]  Live born female "Clifton James" Birth Weight:  8lbs 11oz APGAR: 65, 9  Newborn Delivery   Birth date/time: 08/27/2020 00:11:00 Delivery type: Vaginal, Spontaneous     Feeding  planned: Breast  ---------- Drinda Butts, CNM Certified Nurse Midwife [facility]

## 2020-08-03 LAB — OB RESULTS CONSOLE GBS: GBS: NEGATIVE

## 2020-08-22 ENCOUNTER — Other Ambulatory Visit: Payer: Self-pay | Admitting: Obstetrics and Gynecology

## 2020-08-22 NOTE — Progress Notes (Signed)
Dating: EDD: 08/22/20  by LMP: 11/16/19 and c/w [place] at [redacted]w[redacted]d   [facility] site: [place]  Preg c/b: A1GDM- well controlled Hypothyroidism, on Synthroid 20mcg daily Hx prolactinoma, q5mo prolactin, last 08/01/20 Spanish speaking  Prenatal Labs: Blood type/Rh A pos  Antibody screen neg  Rubella Immune  Varicella Immune  RPR NR  HBsAg Neg  HIV NR  GC neg  Chlamydia neg  Genetic screening  declined  1 hour GTT 161  3 hour GTT  [phone]  GBS  Negative    Contraception: unsure Infant feeding: breast Tdap: 06/07/20 Flu:03/01/20

## 2020-08-24 ENCOUNTER — Other Ambulatory Visit: Payer: Self-pay

## 2020-08-24 ENCOUNTER — Other Ambulatory Visit
Admission: RE | Admit: 2020-08-24 | Discharge: 2020-08-24 | Disposition: A | Discharge status: Home / Self Care | Payer: Self-pay | Source: Ambulatory Visit | Attending: Obstetrics and Gynecology | Admitting: Obstetrics and Gynecology

## 2020-08-24 DIAGNOSIS — Z01812 Encounter for preprocedural laboratory examination: Secondary | ICD-10-CM | POA: Insufficient documentation

## 2020-08-24 DIAGNOSIS — Z20822 Contact with and (suspected) exposure to covid-19: Secondary | ICD-10-CM | POA: Insufficient documentation

## 2020-08-24 LAB — SARS CORONAVIRUS 2 (TAT 6-24 HRS): SARS Coronavirus 2: NEGATIVE

## 2020-08-25 ENCOUNTER — Encounter: Payer: Self-pay | Admitting: *Deleted

## 2020-08-26 ENCOUNTER — Encounter: Payer: Self-pay | Admitting: Obstetrics and Gynecology

## 2020-08-26 ENCOUNTER — Inpatient Hospital Stay: Payer: Medicaid Other | Admitting: Registered Nurse

## 2020-08-26 ENCOUNTER — Inpatient Hospital Stay
Admission: EM | Admit: 2020-08-26 | Discharge: 2020-08-28 | DRG: 768 | Disposition: A | Discharge status: Home / Self Care | Payer: Medicaid Other

## 2020-08-26 ENCOUNTER — Other Ambulatory Visit: Payer: Self-pay

## 2020-08-26 DIAGNOSIS — O99284 Endocrine, nutritional and metabolic diseases complicating childbirth: Secondary | ICD-10-CM | POA: Diagnosis present

## 2020-08-26 DIAGNOSIS — O9081 Anemia of the puerperium: Secondary | ICD-10-CM | POA: Diagnosis not present

## 2020-08-26 DIAGNOSIS — D62 Acute posthemorrhagic anemia: Secondary | ICD-10-CM | POA: Diagnosis not present

## 2020-08-26 DIAGNOSIS — O24420 Gestational diabetes mellitus in childbirth, diet controlled: Secondary | ICD-10-CM | POA: Diagnosis present

## 2020-08-26 DIAGNOSIS — E039 Hypothyroidism, unspecified: Secondary | ICD-10-CM | POA: Diagnosis present

## 2020-08-26 DIAGNOSIS — O24419 Gestational diabetes mellitus in pregnancy, unspecified control: Secondary | ICD-10-CM | POA: Diagnosis present

## 2020-08-26 DIAGNOSIS — Z3A40 40 weeks gestation of pregnancy: Secondary | ICD-10-CM

## 2020-08-26 LAB — COMPREHENSIVE METABOLIC PANEL
ALT: 12 U/L (ref 0–44)
AST: 18 U/L (ref 15–41)
Albumin: 3.1 g/dL — ABNORMAL LOW (ref 3.5–5.0)
Alkaline Phosphatase: 116 U/L (ref 38–126)
Anion gap: 6 (ref 5–15)
BUN: 9 mg/dL (ref 6–20)
CO2: 20 mmol/L — ABNORMAL LOW (ref 22–32)
Calcium: 9 mg/dL (ref 8.9–10.3)
Chloride: 107 mmol/L (ref 98–111)
Creatinine, Ser: 0.45 mg/dL (ref 0.44–1.00)
GFR, Estimated: >60 mL/min (ref >60)
Glucose, Bld: 94 mg/dL (ref 70–99)
Potassium: 3.7 mmol/L (ref 3.5–5.1)
Sodium: 133 mmol/L — ABNORMAL LOW (ref 135–145)
Total Bilirubin: 0.7 mg/dL (ref 0.3–1.2)
Total Protein: 6.2 g/dL — ABNORMAL LOW (ref 6.5–8.1)

## 2020-08-26 LAB — TYPE AND SCREEN
ABO/RH(D): A POS
Antibody Screen: NEGATIVE

## 2020-08-26 LAB — CBC
HCT: 31.7 % — ABNORMAL LOW (ref 36.0–46.0)
Hemoglobin: 11.1 g/dL — ABNORMAL LOW (ref 12.0–15.0)
MCH: 32.2 pg (ref 26.0–34.0)
MCHC: 35 g/dL (ref 30.0–36.0)
MCV: 91.9 fL (ref 80.0–100.0)
Platelets: 169 10*3/uL (ref 150–400)
RBC: 3.45 MIL/uL — ABNORMAL LOW (ref 3.87–5.11)
RDW: 12.5 % (ref 11.5–15.5)
WBC: 6.2 10*3/uL (ref 4.0–10.5)
nRBC: 0 % (ref 0.0–0.2)

## 2020-08-26 LAB — HEMOGLOBIN A1C
Hgb A1c MFr Bld: 5.3 % (ref 4.8–5.6)
Mean Plasma Glucose: 105.41 mg/dL

## 2020-08-26 LAB — GLUCOSE, CAPILLARY
Glucose-Capillary: 107 mg/dL — ABNORMAL HIGH (ref 70–99)
Glucose-Capillary: 86 mg/dL (ref 70–99)
Glucose-Capillary: 97 mg/dL (ref 70–99)

## 2020-08-26 LAB — RPR: RPR Ser Ql: NONREACTIVE

## 2020-08-26 MED ORDER — PHENYLEPHRINE 40 MCG/ML (10ML) SYRINGE FOR IV PUSH (FOR BLOOD PRESSURE SUPPORT)
80.0000 ug | PREFILLED_SYRINGE | INTRAVENOUS | Status: DC | PRN
  Filled 2020-08-26: qty 10

## 2020-08-26 MED ORDER — OXYTOCIN BOLUS FROM INFUSION
333.0000 mL | Freq: Once | INTRAVENOUS | Status: AC
  Administered 2020-08-27: 333 mL via INTRAVENOUS

## 2020-08-26 MED ORDER — DIPHENHYDRAMINE HCL 50 MG/ML IJ SOLN: 50.0000 mg | Freq: Once | INTRAMUSCULAR | Status: AC

## 2020-08-26 MED ORDER — BUTORPHANOL TARTRATE 1 MG/ML IJ SOLN: 1.0000 mg | Freq: Once | INTRAMUSCULAR | Status: AC

## 2020-08-26 MED ORDER — DIPHENHYDRAMINE HCL 50 MG/ML IJ SOLN
INTRAMUSCULAR | Status: AC
  Administered 2020-08-26: 50 mg via INTRAVENOUS
  Filled 2020-08-26: qty 1

## 2020-08-26 MED ORDER — ACETAMINOPHEN 325 MG PO TABS
650.0000 mg | ORAL_TABLET | ORAL | Status: DC | PRN
  Administered 2020-08-26 (×2): 650 mg via ORAL
  Filled 2020-08-26 (×2): qty 2

## 2020-08-26 MED ORDER — LACTATED RINGERS IV SOLN
500.0000 mL | INTRAVENOUS | Status: DC | PRN
  Administered 2020-08-26: 500 mL via INTRAVENOUS

## 2020-08-26 MED ORDER — LIDOCAINE HCL (PF) 1 % IJ SOLN
30.0000 mL | INTRAMUSCULAR | Status: AC | PRN
Start: 2020-08-26 — End: 2020-08-27

## 2020-08-26 MED ORDER — TERBUTALINE SULFATE 1 MG/ML IJ SOLN: 0.2500 mg | Freq: Once | INTRAMUSCULAR | Status: DC | PRN

## 2020-08-26 MED ORDER — SOD CITRATE-CITRIC ACID 500-334 MG/5ML PO SOLN: 30.0000 mL | ORAL | Status: DC | PRN

## 2020-08-26 MED ORDER — ONDANSETRON HCL 4 MG/2ML IJ SOLN: 4.0000 mg | Freq: Four times a day (QID) | INTRAMUSCULAR | Status: DC | PRN

## 2020-08-26 MED ORDER — AMMONIA AROMATIC IN INHA
RESPIRATORY_TRACT | Status: AC
  Filled 2020-08-26: qty 10

## 2020-08-26 MED ORDER — LIDOCAINE-EPINEPHRINE (PF) 1.5 %-1:200000 IJ SOLN
INTRAMUSCULAR | Status: DC | PRN
  Administered 2020-08-26: 3 mL via EPIDURAL

## 2020-08-26 MED ORDER — OXYTOCIN-SODIUM CHLORIDE 30-0.9 UT/500ML-% IV SOLN
1.0000 m[IU]/min | INTRAVENOUS | Status: DC
  Administered 2020-08-26: 2 m[IU]/min via INTRAVENOUS
  Filled 2020-08-26: qty 500

## 2020-08-26 MED ORDER — BUTORPHANOL TARTRATE 1 MG/ML IJ SOLN
1.0000 mg | Freq: Once | INTRAMUSCULAR | Status: AC
  Administered 2020-08-26: 1 mg via INTRAVENOUS
  Filled 2020-08-26: qty 1

## 2020-08-26 MED ORDER — FENTANYL 2.5 MCG/ML W/ROPIVACAINE 0.15% IN NS 100 ML EPIDURAL (ARMC)
EPIDURAL | Status: DC | PRN
  Administered 2020-08-26: 12 mL/h via EPIDURAL

## 2020-08-26 MED ORDER — LACTATED RINGERS IV SOLN: 500.0000 mL | Freq: Once | INTRAVENOUS | Status: DC

## 2020-08-26 MED ORDER — BUPIVACAINE HCL (PF) 0.25 % IJ SOLN
INTRAMUSCULAR | Status: DC | PRN
  Administered 2020-08-26: 3 mL via EPIDURAL

## 2020-08-26 MED ORDER — MISOPROSTOL 25 MCG QUARTER TABLET
25.0000 ug | ORAL_TABLET | ORAL | Status: DC | PRN
Start: 2020-08-26 — End: 2020-08-27
  Administered 2020-08-26: 25 ug via BUCCAL
  Filled 2020-08-26 (×2): qty 1

## 2020-08-26 MED ORDER — EPHEDRINE 5 MG/ML INJ
10.0000 mg | INTRAVENOUS | Status: DC | PRN
  Filled 2020-08-26: qty 2

## 2020-08-26 MED ORDER — FENTANYL CITRATE (PF) 100 MCG/2ML IJ SOLN
50.0000 ug | INTRAMUSCULAR | Status: DC | PRN
  Filled 2020-08-26 (×2): qty 2

## 2020-08-26 MED ORDER — LACTATED RINGERS IV SOLN: INTRAVENOUS | Status: DC

## 2020-08-26 MED ORDER — BUTORPHANOL TARTRATE 1 MG/ML IJ SOLN
INTRAMUSCULAR | Status: AC
  Administered 2020-08-26: 1 mg via INTRAVENOUS
  Filled 2020-08-26: qty 1

## 2020-08-26 MED ORDER — MISOPROSTOL 25 MCG QUARTER TABLET
25.0000 ug | ORAL_TABLET | ORAL | Status: DC | PRN
  Administered 2020-08-26: 25 ug via VAGINAL
  Filled 2020-08-26 (×2): qty 1

## 2020-08-26 MED ORDER — MISOPROSTOL 200 MCG PO TABS
ORAL_TABLET | ORAL | Status: AC
  Filled 2020-08-26: qty 4

## 2020-08-26 MED ORDER — DIPHENHYDRAMINE HCL 50 MG/ML IJ SOLN: 12.5000 mg | INTRAMUSCULAR | Status: DC | PRN

## 2020-08-26 MED ORDER — FENTANYL 2.5 MCG/ML W/ROPIVACAINE 0.15% IN NS 100 ML EPIDURAL (ARMC)
12.0000 mL/h | EPIDURAL | Status: DC
Start: 2020-08-26 — End: 2020-08-27
  Administered 2020-08-26: 12 mL/h via EPIDURAL
  Filled 2020-08-26: qty 100

## 2020-08-26 MED ORDER — LIDOCAINE HCL (PF) 1 % IJ SOLN
INTRAMUSCULAR | Status: DC | PRN
  Administered 2020-08-26: 3 mL via EPIDURAL

## 2020-08-26 MED ORDER — FENTANYL 2.5 MCG/ML W/ROPIVACAINE 0.15% IN NS 100 ML EPIDURAL (ARMC)
EPIDURAL | Status: AC
  Filled 2020-08-26: qty 100

## 2020-08-26 MED ORDER — OXYTOCIN-SODIUM CHLORIDE 30-0.9 UT/500ML-% IV SOLN
2.5000 [IU]/h | INTRAVENOUS | Status: DC
  Administered 2020-08-27: 2.5 [IU]/h via INTRAVENOUS

## 2020-08-26 MED ORDER — OXYTOCIN 10 UNIT/ML IJ SOLN
INTRAMUSCULAR | Status: AC
  Filled 2020-08-26: qty 2

## 2020-08-26 MED ORDER — LIDOCAINE HCL (PF) 1 % IJ SOLN
INTRAMUSCULAR | Status: AC
  Administered 2020-08-27: 30 mL via SUBCUTANEOUS
  Filled 2020-08-26: qty 30

## 2020-08-26 NOTE — Anesthesia Preprocedure Evaluation (Signed)
Anesthesia Evaluation  Patient identified by MRN, date of birth, ID band Patient awake    Reviewed: Allergy & Precautions, H&P , NPO status , Patient's Chart, lab work & pertinent test results  Airway Mallampati: II  TM Distance: >3 FB Neck ROM: full    Dental no notable dental hx.    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 Patient states she sees a neurologist regularly but no physical neurological effects of tumor present.   negative psych ROS   GI/Hepatic negative GI ROS, Neg liver ROS,   Endo/Other  diabetes, Well Controlled, Gestational  Renal/GU      Musculoskeletal   Abdominal   Peds  Hematology negative hematology ROS (+)   Anesthesia Other Findings   Reproductive/Obstetrics (+) Pregnancy                             Anesthesia Physical Anesthesia Plan  ASA: II  Anesthesia Plan: Epidural   Post-op Pain Management:    Induction:   PONV Risk Score and Plan:   Airway Management Planned:   Additional Equipment:   Intra-op Plan:   Post-operative Plan:   Informed Consent: I have reviewed the patients History and Physical, chart, labs and discussed the procedure including the risks, benefits and alternatives for the proposed anesthesia with the patient or authorized representative who has indicated his/her understanding and acceptance.     Dental Advisory Given  Plan Discussed with: Anesthesiologist and CRNA  Anesthesia Plan Comments:         Anesthesia Quick Evaluation

## 2020-08-26 NOTE — Anesthesia Procedure Notes (Signed)
Epidural Patient location during procedure: OB Start time: 08/26/2020 10:47 AM End time: 08/26/2020 10:54 AM  Staffing Anesthesiologist: Arita Miss, MD Resident/CRNA: Hedda Slade, CRNA Performed: resident/CRNA   Preanesthetic Checklist Completed: patient identified, IV checked, site marked, risks and benefits discussed, surgical consent, monitors and equipment checked, pre-op evaluation and timeout performed  Epidural Patient position: sitting Prep: ChloraPrep Patient monitoring: heart rate, continuous pulse ox and blood pressure Approach: midline Location: L3-L4 Injection technique: LOR air  Needle:  Needle type: Tuohy  Needle gauge: 17 G Needle length: 9 cm and 9 Needle insertion depth: 6 cm Catheter type: closed end flexible Catheter size: 19 Gauge Catheter at skin depth: 11 cm Test dose: negative and 1.5% lidocaine with Epi 1:200 K  Assessment Events: blood not aspirated, injection not painful, no injection resistance, no paresthesia and negative IV test  Additional Notes 1 attempt Pt. Evaluated and documentation done after procedure finished. Patient identified. Risks/Benefits/Options discussed with patient including but not limited to bleeding, infection, nerve damage, paralysis, failed block, incomplete pain control, headache, blood pressure changes, nausea, vomiting, reactions to medication both or allergic, itching and postpartum back pain. Confirmed with bedside nurse the patient's most recent platelet count. Confirmed with patient that they are not currently taking any anticoagulation, have any bleeding history or any family history of bleeding disorders. Patient expressed understanding and wished to proceed. All questions were answered. Sterile technique was used throughout the entire procedure. Please see nursing notes for vital signs. Test dose was given through epidural catheter and negative prior to continuing to dose epidural or start infusion. Warning signs of  high block given to the patient including shortness of breath, tingling/numbness in hands, complete motor block, or any concerning symptoms with instructions to call for help. Patient was given instructions on fall risk and not to get out of bed. All questions and concerns addressed with instructions to call with any issues or inadequate analgesia.   Patient tolerated the insertion well without immediate complications.Reason for block:procedure for pain

## 2020-08-26 NOTE — H&P (Signed)
OB History & Physical   History of Present Illness:   Chief Complaint: presents for scheduled IOL  HPI:  Linda Dorsey is a 35 y.o. G2P0010 female at [redacted]w[redacted]d dated by 11/16/2019, c/w [place] at [redacted]w[redacted]d.  She presents to L&D for scheduled IOL for A1GDM.  Reports active fetal movement  Contractions: irregular cramping  LOF/SROM: denies  Vaginal bleeding: denies   Pregnancy Issues: 1. A1GDM - well controlled  2. Hypothyroidism  3. History of prolactinoma - needs q11mo prolactin   Patient Active Problem List   Diagnosis Date Noted  . Gestational diabetes mellitus (GDM) in third trimester 08/26/2020     Maternal Medical History:   Past Medical History:  Diagnosis Date  . Thyroid disease    hypothyroid  . Tumor    brain tumor, noncancerous    History reviewed. No pertinent surgical history.  No Known Allergies  Prior to Admission medications   Medication Sig Start Date End Date Taking? Authorizing Provider  levothyroxine (SYNTHROID) 25 MCG tablet Take 25 mcg by mouth daily before breakfast.   Yes [provider]  Prenatal Vit-Fe Fumarate-FA (PRENATAL VITAMINS PO) Take 2 tablets by mouth. 2 gummies per day   Yes [provider]  levothyroxine (SYNTHROID) 25 MCG tablet Take 25 mcg by mouth daily before breakfast. Correct name for medication is Eutirox (Levotiroxina sodica 25 mcg) - received medicine in [place]    [provider]     Prenatal care site:  [facility]  Social History: She  reports that she has never smoked. She has never used smokeless tobacco. She reports previous alcohol use. She reports previous drug use.  Family History: family history is not on file.   Review of Systems: A full review of systems was performed and negative except as noted in the HPI.     Physical Exam:  Vital Signs: BP 113/65   Pulse 70   Temp 98.9 F (37.2 C) (Oral)   Resp 16   Ht 5' 1.42" (1.56 m)   Wt 72.1 kg   LMP 11/16/2019   BMI 29.64 kg/m   Physical Exam  General: no acute distress.  HEENT: normocephalic, atraumatic Heart: regular rate & rhythm.  No murmurs/rubs/gallops Lungs: clear to auscultation bilaterally, normal respiratory effort Abdomen: soft, gravid, non-tender;  EFW: 7lbs Pelvic:   External: Normal external female genitalia  Cervix: Dilation: 1 / Effacement (%): 80 / Station: -2    Extremities: non-tender, symmetric, no edema bilaterally.  DTRs: 2+/2+  Neurologic: Alert & oriented x 3.    Results for orders placed or performed during the hospital encounter of 08/26/20 (from the past 24 hour(s))  CBC     Status: Abnormal   Collection Time: 08/26/20  1:14 AM  Result Value Ref Range   WBC 6.2 4.0 - 10.5 K/uL   RBC 3.45 (L) 3.87 - 5.11 MIL/uL   Hemoglobin 11.1 (L) 12.0 - 15.0 g/dL   HCT 31.7 (L) 36.0 - 46.0 %   MCV 91.9 80.0 - 100.0 fL   MCH 32.2 26.0 - 34.0 pg   MCHC 35.0 30.0 - 36.0 g/dL   RDW 12.5 11.5 - 15.5 %   Platelets 169 150 - 400 K/uL   nRBC 0.0 0.0 - 0.2 %  Type and screen     Status: None   Collection Time: 08/26/20  1:14 AM  Result Value Ref Range   ABO/RH(D) A POS    Antibody Screen NEG    Sample Expiration  08/29/2020,2359 Performed at [facility], [address] [place],  [address]   Comprehensive metabolic panel     Status: Abnormal   Collection Time: 08/26/20  1:14 AM  Result Value Ref Range   Sodium 133 (L) 135 - 145 mmol/L   Potassium 3.7 3.5 - 5.1 mmol/L   Chloride 107 98 - 111 mmol/L   CO2 20 (L) 22 - 32 mmol/L   Glucose, Bld 94 70 - 99 mg/dL   BUN 9 6 - 20 mg/dL   Creatinine, Ser 0.45 0.44 - 1.00 mg/dL   Calcium 9.0 8.9 - 10.3 mg/dL   Total Protein 6.2 (L) 6.5 - 8.1 g/dL   Albumin 3.1 (L) 3.5 - 5.0 g/dL   AST 18 15 - 41 U/L   ALT 12 0 - 44 U/L   Alkaline Phosphatase 116 38 - 126 U/L   Total Bilirubin 0.7 0.3 - 1.2 mg/dL   GFR, Estimated >60 >60 mL/min   Anion gap 6 5 - 15  Hemoglobin A1c     Status: None   Collection Time: 08/26/20  1:14 AM   Result Value Ref Range   Hgb A1c MFr Bld 5.3 4.8 - 5.6 %   Mean Plasma Glucose 105.41 mg/dL  Glucose, capillary     Status: Abnormal   Collection Time: 08/26/20  1:43 AM  Result Value Ref Range   Glucose-Capillary 107 (H) 70 - 99 mg/dL    Pertinent Results:  Prenatal Labs: Blood type/Rh A pos  Antibody screen neg  Rubella Immune  Varicella Immune  RPR NR  HBsAg Neg  HIV NR  GC neg  Chlamydia neg  Genetic screening  declined  1 hour GTT 161  3 hour GTT  [phone]  GBS  Negative    FHT: Baseline: 140 bpm, Variability: moderate, Accelerations: present and Decelerations: Absent TOCO: regular, every 2-3 minutes SVE:  Dilation: 1 / Effacement (%): 80 / Station: -2  -Previous exam on admission WU/13/-2   Cephalic by Leop[place] and SVE   No results found.  Assessment:  Linda Dorsey is a 35 y.o. G2P0010 female at [redacted]w[redacted]d with A1GDM.   Plan:  1. Admit to Labor & Delivery; consents reviewed and obtained - Covid admission screen megatove   2. Fetal Well being  - Fetal Tracing: cat 1 - Group B Streptococcus ppx not indicated: GBS Neg - Presentation: cephalic confirmed by SVE   3. Routine OB: - Prenatal labs reviewed, as above - Rh pos - CBC, T&S, RPR on admit - Carb modified diet, saline lock - CBG monitoring fasting and 2hr PP  4. Induction of labor  -  Contractions monitored with external toco -  Pelvis adequate for trial of labor  -  Plan for induction with misoprostol -> s/p 1 dose of vaginal and buccal misoprostol  -  Oxytocin and AROM as appropriate  -  Plan for  continuous fetal monitoring -  Maternal pain control as desired; planning regional anesthesia - Anticipate vaginal delivery  5. Post Partum Planning: - Infant feeding: breast - Contraception: TBD - Tdap vaccine: given 06/07/2020 - Flu vaccine: given 03/01/2020  [name], CNM 08/26/20 6:37 AM  Drinda Butts, CNM Certified Nurse Midwife [facility]

## 2020-08-26 NOTE — Progress Notes (Signed)
Labor Progress Note  Linda Dorsey is a 35 y.o. G2P0010 at [redacted]w[redacted]d by LMP admitted for induction of labor due to A1GDM.  Subjective: comfortable with epidural, pain well controlled   Objective: BP 108/71   Pulse 72   Temp 98.3 F (36.8 C) (Oral)   Resp 16   Ht 5' 1.42" (1.56 m)   Wt 72.1 kg   LMP 11/16/2019   BMI 29.64 kg/m  Notable VS details: reviewed   Fetal Assessment: FHT:  FHR: 135 bpm, variability: moderate,  accelerations:  Present,  decelerations:  Absent Category/reactivity:  Category I UC:   regular, every 2-3 minutes SVE:   6/90/-1 Membrane status: SROM at 1135 - Forebag ruptured during exam  Amniotic color: Thick meconium   Labs: Lab Results  Component Value Date   WBC 6.2 08/26/2020   HGB 11.1 (L) 08/26/2020   HCT 31.7 (L) 08/26/2020   MCV 91.9 08/26/2020   PLT 169 08/26/2020    Assessment / Plan: IOL for A1GDM, s/p 1 dose of vaginal and buccal misoprostol.    -Progressing well after misoprostol -Will consider oxytocin as appropriate if labor stalls or contractions space out   Labor: Progressing normally Preeclampsia:  no signs or symptoms of toxicity Fetal Wellbeing:  Category I Pain Control:  Epidural I/D:  n/a Anticipated MOD:  NSVD  [name], CNM 08/26/2020, 4:55 PM

## 2020-08-26 NOTE — Progress Notes (Signed)
Labor Progress Note  Linda Dorsey is a 35 y.o. G2P0010 at [redacted]w[redacted]d by LMP admitted for induction of labor due to A1GDM.  Subjective: Reports intermittent, mild pressure.  Remains comfortable with epidural.  Objective: BP 117/69   Pulse 68   Temp 98.6 F (37 C) (Oral)   Resp 17   Ht 5' 1.42" (1.56 m)   Wt 72.1 kg   LMP 11/16/2019   BMI 29.64 kg/m  Notable VS details: reviewed   Fetal Assessment: FHT:  FHR: 130 bpm, variability: moderate,  accelerations:  Present,  decelerations:  Absent Category/reactivity:  Category I UC:   regular, every 3-6 minutes SVE:   6-7/80/-1/edematous anterior lip Membrane status: SROM at 1135 Amniotic color: thick meconium  Labs: Lab Results  Component Value Date   WBC 6.2 08/26/2020   HGB 11.1 (L) 08/26/2020   HCT 31.7 (L) 08/26/2020   MCV 91.9 08/26/2020   PLT 169 08/26/2020    Assessment / Plan: Induction of labor for A1GDM  -S/P 1 dose of vaginal and buccal misoprostol -No cervical change over past 4 hours -Anterior portio of cervix feels edematous -Will start oxytocin  -Give IV benadryl  -Utilize side lying positions with peanut ball to help with rotation and descent   Labor: Protracted active phase, starting oxytocin  Preeclampsia:  no signs or symptoms of toxicity Fetal Wellbeing:  Category II Pain Control:  Epidural I/D:  ROM x 10 hours, temp 99.5, GBS negative Anticipated MOD:  [place], CNM 08/26/2020, 9:02 PM

## 2020-08-26 NOTE — Progress Notes (Signed)
Labor Progress Note  [employer] Linda Dorsey is a 35 y.o. G2P0010 at [redacted]w[redacted]d by LMP admitted for induction of labor due to A1GDM.  Subjective: feeling comfortable after epidural  Objective: BP 102/61   Pulse 64   Temp 98.5 F (36.9 C) (Oral)   Resp 18   Ht 5' 1.42" (1.56 m)   Wt 72.1 kg   LMP 11/16/2019   BMI 29.64 kg/m  Notable VS details: reviewed   Fetal Assessment: FHT:  FHR: 125 bpm, variability: moderate,  accelerations:  Present,  decelerations:  Absent Category/reactivity:  Category I UC:   regular, every 2 minutes SVE:   4-5/90/-1/ant by RN Membrane status: SROM at 1135 Amniotic color: clear   Labs: Lab Results  Component Value Date   WBC 6.2 08/26/2020   HGB 11.1 (L) 08/26/2020   HCT 31.7 (L) 08/26/2020   MCV 91.9 08/26/2020   PLT 169 08/26/2020    Assessment / Plan: Induction of labor for A1GDM, s/p 1 dose of vaginal and buccal misoprostol.  Progressing well  Labor: Progressing normally Preeclampsia:  no signs or symptoms of toxicity Fetal Wellbeing:  Category I Pain Control:  Epidural I/D:  n/a Anticipated MOD:  NSVD  [name], CNM 08/26/2020, 12:56 PM

## 2020-08-26 NOTE — Progress Notes (Signed)
Interpreter number: [id number] Admission and education completed

## 2020-08-27 ENCOUNTER — Encounter: Payer: Self-pay | Admitting: Obstetrics and Gynecology

## 2020-08-27 LAB — CBC
HCT: 28.3 % — ABNORMAL LOW (ref 36.0–46.0)
Hemoglobin: 9.9 g/dL — ABNORMAL LOW (ref 12.0–15.0)
MCH: 32.4 pg (ref 26.0–34.0)
MCHC: 35 g/dL (ref 30.0–36.0)
MCV: 92.5 fL (ref 80.0–100.0)
Platelets: 148 10*3/uL — ABNORMAL LOW (ref 150–400)
RBC: 3.06 MIL/uL — ABNORMAL LOW (ref 3.87–5.11)
RDW: 12.9 % (ref 11.5–15.5)
WBC: 10.3 10*3/uL (ref 4.0–10.5)
nRBC: 0 % (ref 0.0–0.2)

## 2020-08-27 MED ORDER — ONDANSETRON HCL 4 MG/2ML IJ SOLN: 4.0000 mg | INTRAMUSCULAR | Status: DC | PRN

## 2020-08-27 MED ORDER — FERROUS SULFATE 325 (65 FE) MG PO TABS
325.0000 mg | ORAL_TABLET | Freq: Two times a day (BID) | ORAL | Status: DC
  Administered 2020-08-27 – 2020-08-28 (×2): 325 mg via ORAL
  Filled 2020-08-27 (×2): qty 1

## 2020-08-27 MED ORDER — ACETAMINOPHEN 500 MG PO TABS: 1000.0000 mg | ORAL_TABLET | Freq: Four times a day (QID) | ORAL | Status: DC | PRN

## 2020-08-27 MED ORDER — SIMETHICONE 80 MG PO CHEW: 80.0000 mg | CHEWABLE_TABLET | ORAL | Status: DC | PRN

## 2020-08-27 MED ORDER — IBUPROFEN 600 MG PO TABS
ORAL_TABLET | ORAL | Status: AC
  Filled 2020-08-27: qty 1

## 2020-08-27 MED ORDER — IBUPROFEN 600 MG PO TABS
600.0000 mg | ORAL_TABLET | Freq: Four times a day (QID) | ORAL | Status: DC
  Administered 2020-08-27 – 2020-08-28 (×6): 600 mg via ORAL
  Filled 2020-08-27 (×5): qty 1

## 2020-08-27 MED ORDER — COCONUT OIL OIL
1.0000 "application " | TOPICAL_OIL | Status: DC | PRN
  Filled 2020-08-27: qty 120

## 2020-08-27 MED ORDER — LEVOTHYROXINE SODIUM 75 MCG PO TABS
75.0000 ug | ORAL_TABLET | Freq: Every day | ORAL | Status: DC
  Filled 2020-08-27 (×4): qty 1

## 2020-08-27 MED ORDER — DOCUSATE SODIUM 100 MG PO CAPS
100.0000 mg | ORAL_CAPSULE | Freq: Two times a day (BID) | ORAL | Status: DC
  Administered 2020-08-27 – 2020-08-28 (×2): 100 mg via ORAL
  Filled 2020-08-27 (×2): qty 1

## 2020-08-27 MED ORDER — SENNA 8.6 MG PO TABS
1.0000 | ORAL_TABLET | Freq: Every day | ORAL | Status: DC | PRN
  Filled 2020-08-27: qty 1

## 2020-08-27 MED ORDER — ONDANSETRON HCL 4 MG PO TABS: 4.0000 mg | ORAL_TABLET | ORAL | Status: DC | PRN

## 2020-08-27 MED ORDER — WITCH HAZEL-GLYCERIN EX PADS
1.0000 "application " | MEDICATED_PAD | CUTANEOUS | Status: DC
  Administered 2020-08-27: 1 via TOPICAL
  Filled 2020-08-27: qty 100

## 2020-08-27 MED ORDER — DIBUCAINE (PERIANAL) 1 % EX OINT
1.0000 "application " | TOPICAL_OINTMENT | CUTANEOUS | Status: DC | PRN
  Administered 2020-08-27: 1 via RECTAL
  Filled 2020-08-27: qty 28

## 2020-08-27 MED ORDER — BENZOCAINE-MENTHOL 20-0.5 % EX AERO
1.0000 "application " | INHALATION_SPRAY | CUTANEOUS | Status: DC | PRN
  Administered 2020-08-27: 1 via TOPICAL
  Filled 2020-08-27: qty 56

## 2020-08-27 MED ORDER — OXYCODONE HCL 5 MG PO TABS
5.0000 mg | ORAL_TABLET | ORAL | Status: DC | PRN
  Administered 2020-08-27: 5 mg via ORAL
  Filled 2020-08-27: qty 1

## 2020-08-27 MED ORDER — DIPHENHYDRAMINE HCL 25 MG PO CAPS: 25.0000 mg | ORAL_CAPSULE | Freq: Four times a day (QID) | ORAL | Status: DC | PRN

## 2020-08-27 MED ORDER — PRENATAL MULTIVITAMIN CH
1.0000 | ORAL_TABLET | Freq: Every day | ORAL | Status: DC
  Administered 2020-08-27 – 2020-08-28 (×2): 1 via ORAL
  Filled 2020-08-27 (×2): qty 1

## 2020-08-27 NOTE — Progress Notes (Signed)
Postpartum Day  0  Subjective: up ad lib, voiding and tolerating PO  Doing well. Reports perineal pain as sore.  Reported dizziness with ambulation to bathroom in LDR.  Was able to ambulate to bathroom on M/B without dizziness.  Pain managed with PO meds, tolerating regular diet, and voiding without difficulty.   No fever/chills, chest pain, shortness of breath, nausea/vomiting, or leg pain. No nipple or breast pain. No headache, visual changes, or RUQ/epigastric pain.  Objective: BP 104/63 (BP Location: Right Arm)   Pulse 74   Temp 98.5 F (36.9 C) (Oral)   Resp 18   Ht 5' 1.42" (1.56 m)   Wt 72.1 kg   LMP 11/16/2019   SpO2 98%   Breastfeeding Unknown   BMI 29.64 kg/m    Physical Exam:  General: alert, cooperative, no distress and pale Breasts: soft/nontender CV: RRR Pulm: nl effort, CTABL Abdomen: soft, non-tender, active bowel sounds Uterine Fundus: firm Perineum: moderate edema, repair well approximated Lochia: appropriate DVT Evaluation: No evidence of DVT seen on physical exam.  Recent Labs    08/26/20 0114 08/27/20 0756  HGB 11.1* 9.9*  HCT 31.7* 28.3*  WBC 6.2 10.3  PLT 169 148*    Assessment/Plan: 35 y.o. G2P1011 postpartum day # 0  -Continue routine postpartum care -Lactation consult PRN for breastfeeding  -Acute blood loss anemia - hemodynamically stable and asymptomatic; start PO ferrous sulfate BID with stool softeners  -Ambulate with nursing assistance to monitor for signs of symptomatic anemia. -Discussed possible iron transfusion versus blood transfusion if needed if she develops symptomatic anemia -Will check CBC in AM -Continue with stool softeners to prevent constipation -Sitz bath PRN  -Immunization status:   all immunizations up to date   Disposition: Continue inpatient postpartum care   LOS: 1 day   [name], CNM 08/27/2020, 11:46 AM   ----- Drinda Butts  Certified Nurse Midwife [facility]

## 2020-08-27 NOTE — Anesthesia Postprocedure Evaluation (Signed)
Anesthesia Post Note  Patient: [name]  Procedure(s) Performed: AN AD HOC LABOR EPIDURAL  Patient location during evaluation: Mother Baby Anesthesia Type: Epidural Level of consciousness: awake and alert Pain management: pain level controlled Vital Signs Assessment: post-procedure vital signs reviewed and stable Respiratory status: spontaneous breathing, nonlabored ventilation and respiratory function stable Cardiovascular status: stable Postop Assessment: no headache, no backache and epidural receding Anesthetic complications: no   No complications documented.   Last Vitals:  Vitals:   08/27/20 0513 08/27/20 0651  BP: 101/63 104/63  Pulse: 71 74  Resp: 17 18  Temp: 36.7 C 36.9 C  SpO2:  98%    Last Pain:  Vitals:   08/27/20 0651  TempSrc: Oral  PainSc:                  Tera Mater

## 2020-08-27 NOTE — Lactation Note (Signed)
This note was copied from a baby's chart. Lactation Consultation Note  Patient Name: Boy Daphane Odekirk FOYDX'A Date: 08/27/2020   Age:34 hours  Lactation to the room for initial visit. Mother is holding the baby skin to skin. Encouraged feeding on demand and with cues. If baby is not cueing encouraged hand expression and skin to skin. Taught proper technique for hand expression. Fed drops to baby with finger. Attempted latch because baby was cueing and alert. No latch achieved, baby was fussy with attempt. Baby was left skin to skin with Mother. Encouraged 8 or more attempts in the first 24 hours and 8 or more good feeds after 24 HOL. Reviewed appropriate diapers for days of life and How to know your baby is getting enough to eat. Reviewed "Understanding Postpartum and Newborn Care" INJOY booklet at bedside. [facility] # left on board, encouraged to call for any assistance. Mother has no further questions at this time.   Raelynn Corron D Deneise Getty 08/27/2020, 5:13 PM

## 2020-08-27 NOTE — Discharge Summary (Signed)
Obstetrical Discharge Summary  Patient Name: Linda Dorsey DOB: [birth year] MRN: [id number]  Date of Admission: 08/26/2020 Date of Delivery: 08/27/2020 Delivered by: Drinda Butts, CNM  Date of Discharge: 08/28/2020  Primary OB: Princella Ion EHU:[id number] last menstrual period was 11/16/2019. EDC Estimated Date of Delivery: 08/22/20 Gestational Age at Delivery: [redacted]w[redacted]d   Antepartum complications:  1. A1GDM - well controlled  2. Hypothyroidism  3. History of prolactinoma - needs q13mo prolactin   Admitting Diagnosis: IOL for A1GDM Secondary Diagnosis: SVD 3rd deg lac  Patient Active Problem List   Diagnosis Date Noted  . Gestational diabetes mellitus (GDM) in third trimester 08/26/2020    Induction: Pitocin and Cytotec Complications: 3rd degree laceration  Intrapartum complications/course: Linda Dorsey presented to L&D for scheduled IOL for A1GDM.  She progressed well after 1 dose of vaginal and buccal misoprostol.  Oxytocin was initiated after no change for 4 hours.  She progressed quickly to C/C/+1 with an urge to push.  Linda Dorsey pushed well over approximately 2 hours. Delivery Type: spontaneous vaginal delivery Anesthesia: epidural Placenta: spontaneous Laceration: 3a perineal laceration and bilateral sulcal - Repair by Dr. Leafy Ro Episiotomy: none Newborn Data: Live born female "Clifton James" Birth Weight: 8 lb 11 oz (3940 g) APGAR: 60, 9  Newborn Delivery   Birth date/time: 08/27/2020 00:11:00 Delivery type: Vaginal, Spontaneous     Postpartum Procedures: none  Edinburgh:  Edinburgh Postnatal Depression Scale Screening Tool 08/28/2020 08/27/2020 08/27/2020  I have been able to laugh and see the funny side of things. 1 (No Data) (No Data)  I have looked forward with enjoyment to things. 0 - -  I have blamed myself unnecessarily when things went wrong. 0 - -  I have been anxious or worried for no good reason. 2 - -  I have felt scared or panicky for no good reason. 1 - -  Things have  been getting on top of me. 1 - -  I have been so unhappy that I have had difficulty sleeping. 1 - -  I have felt sad or miserable. 0 - -  I have been so unhappy that I have been crying. 1 - -  The thought of harming myself has occurred to me. 1 - -  Edinburgh Postnatal Depression Scale Total 8 - -     Post partum course:  Patient had an uncomplicated postpartum course.  By time of discharge on PPD#1, her pain was controlled on oral pain medications; she had appropriate lochia and was ambulating, voiding without difficulty and tolerating regular diet.  She was deemed stable for discharge to home.     Discharge Physical Exam:  BP (!) 92/53 (BP Location: Left Arm) Comment: notify Dava RN  Pulse 68   Temp 98 F (36.7 C) (Oral)   Resp 18   Ht 5' 1.42" (1.56 m)   Wt 72.1 kg   LMP 11/16/2019   SpO2 98%   Breastfeeding Unknown   BMI 29.64 kg/m   General: NAD CV: RRR Pulm: CTABL, nl effort ABD: s/nd/nt, fundus firm and below the umbilicus Lochia: moderate Perineum: minimal edema/repair well approximated DVT Evaluation: LE non-ttp, no evidence of DVT on exam.  Hemoglobin  Date Value Ref Range Status  08/28/2020 8.8 (L) 12.0 - 15.0 g/dL Final   HCT  Date Value Ref Range Status  08/28/2020 25.1 (L) 36.0 - 46.0 % Final     Disposition: stable, discharge to home. Baby Feeding: breastmilk and formula Baby Disposition: home with mom  Rh Immune globulin  given: Rh pos Rubella vaccine given: Immune Varivax vaccine given: Immune Flu vaccine given in AP or PP setting: given 03/01/2020 Tdap vaccine given in AP or PP setting: given 06/07/2020  Contraception: TBD  Prenatal Labs:  Blood type/Rh A pos  Antibody screen neg  Rubella Immune  Varicella Immune  RPR NR  HBsAg Neg  HIV NR  GC neg  Chlamydia neg  Genetic screening declined  1 hour GTT 161  3 hour GTT [phone]  GBS Negative    Plan:  [facility] was discharged to home in good  condition. Follow-up appointment with delivering provider in 2 weeks.  Discharge Medications: Allergies as of 08/28/2020   No Known Allergies     Medication List    TAKE these medications   acetaminophen 500 MG tablet Commonly known as: TYLENOL Take 2 tablets (1,000 mg total) by mouth every 6 (six) hours as needed (for pain scale < 4).   benzocaine-Menthol 20-0.5 % Aero Commonly known as: DERMOPLAST Apply 1 application topically as needed for irritation (perineal discomfort).   coconut oil Oil Apply 1 application topically as needed.   dibucaine 1 % Oint Commonly known as: NUPERCAINAL Place 1 application rectally as needed for hemorrhoids.   docusate sodium 100 MG capsule Commonly known as: COLACE Take 1 capsule (100 mg total) by mouth 2 (two) times daily.   ferrous sulfate 325 (65 FE) MG tablet Take 1 tablet (325 mg total) by mouth 2 (two) times daily with a meal.   ibuprofen 600 MG tablet Commonly known as: ADVIL Take 1 tablet (600 mg total) by mouth every 6 (six) hours.   levothyroxine 75 MCG tablet Commonly known as: SYNTHROID Take 1 tablet (75 mcg total) by mouth daily at 6 (six) AM. Start taking on: August 29, 2020 What changed:   medication strength  how much to take  when to take this  additional instructions  Another medication with the same name was removed. Continue taking this medication, and follow the directions you see here.   PRENATAL VITAMINS PO Take 2 tablets by mouth. 2 gummies per day   senna 8.6 MG Tabs tablet Commonly known as: SENOKOT Take 1 tablet (8.6 mg total) by mouth at bedtime.   witch hazel-glycerin pad Commonly known as: TUCKS Apply 1 application topically continuous.        Follow-up [facility]. Schedule an appointment as soon as possible for a visit in 2 week(s).   Why: perineal repair check.  May be with Drinda Butts, CNM or Dr. Sallyanne Kuster information: [place] [phone]              Signed: Francetta Found, CNM 08/28/2020 2:40 PM

## 2020-08-27 NOTE — Progress Notes (Addendum)
Procedure - Third deg perineal laceration repair  Preop Dx:  1) 3a perineal laceration  Postop Dx:  1) 3a laceration  Procedure: Third degree perineal repair  Attending Surgeon: Benjaman Kindler, MD MPH Est blood loss: 100  Findings: 3a perineal laceration Complications: none  Disposition: Mother-Baby Unit  Anesthesia: epidural 8cc of 1% lidocaine  DESCRIPTION OF THE PROCEDURE:   Evaluation of the perineum noted a 3a third degree laceration, with the external anal sphincter separated minimally  The bilateral sulcal tears were repaired in 2 layers, and the midline vaginal mucosa was repaired in 2 layers, and there was a 2-3cm undermining layer.  The external sphincter was grasped with an Allis clamps on each side, and then reinforced in the midline. It was closed in an end-to-end fashion with 2-0 Vicryl in multiple interrupted stiches. It came together without excess tissues tension.  The rectovaginal septum and perineal body were brought together in running layers, adding in a crown stitch to bring the deep edges of the bulbocavernosus together in the midline.   The vaginal mucosa was repaired with 2-0 Vicryl Rapide in a running locked fashion, and the perineal edges were brought together in the midline in a subcuticular fashion.The last knot was buried behind the hymen.   Rectal exam with intact mucosa.  Minimal oozing noted from the posterior fourchette, and a single sponge left for pressure, with plan for removal by nursing staff in 30 minutes.  Total length of repair 10.5cm  The patient tolerated the procedure well and is stable in the labor and delivery room.

## 2020-08-28 LAB — CBC
HCT: 25.1 % — ABNORMAL LOW (ref 36.0–46.0)
Hemoglobin: 8.8 g/dL — ABNORMAL LOW (ref 12.0–15.0)
MCH: 32.7 pg (ref 26.0–34.0)
MCHC: 35.1 g/dL (ref 30.0–36.0)
MCV: 93.3 fL (ref 80.0–100.0)
Platelets: 151 10*3/uL (ref 150–400)
RBC: 2.69 MIL/uL — ABNORMAL LOW (ref 3.87–5.11)
RDW: 13.1 % (ref 11.5–15.5)
WBC: 8.7 10*3/uL (ref 4.0–10.5)
nRBC: 0 % (ref 0.0–0.2)

## 2020-08-28 MED ORDER — FERROUS SULFATE 325 (65 FE) MG PO TABS: 325.0000 mg | ORAL_TABLET | Freq: Two times a day (BID) | ORAL | 1 refills | Status: AC

## 2020-08-28 MED ORDER — BENZOCAINE-MENTHOL 20-0.5 % EX AERO: 1.0000 "application " | INHALATION_SPRAY | CUTANEOUS | Status: DC | PRN

## 2020-08-28 MED ORDER — LEVOTHYROXINE SODIUM 75 MCG PO TABS: 75.0000 ug | ORAL_TABLET | Freq: Every day | ORAL | Status: DC

## 2020-08-28 MED ORDER — IBUPROFEN 600 MG PO TABS: 600.0000 mg | ORAL_TABLET | Freq: Four times a day (QID) | ORAL | 0 refills | Status: DC

## 2020-08-28 MED ORDER — SENNA 8.6 MG PO TABS: 1.0000 | ORAL_TABLET | Freq: Every day | ORAL | 0 refills | Status: DC

## 2020-08-28 MED ORDER — DOCUSATE SODIUM 100 MG PO CAPS: 100.0000 mg | ORAL_CAPSULE | Freq: Two times a day (BID) | ORAL | 0 refills | Status: DC

## 2020-08-28 MED ORDER — ACETAMINOPHEN 500 MG PO TABS: 1000.0000 mg | ORAL_TABLET | Freq: Four times a day (QID) | ORAL | 0 refills | Status: DC | PRN

## 2020-08-28 MED ORDER — COCONUT OIL OIL: 1.0000 "application " | TOPICAL_OIL | 0 refills | Status: DC | PRN

## 2020-08-28 MED ORDER — WITCH HAZEL-GLYCERIN EX PADS: 1.0000 "application " | MEDICATED_PAD | CUTANEOUS | 12 refills | Status: DC

## 2020-08-28 MED ORDER — DIBUCAINE (PERIANAL) 1 % EX OINT
1.0000 | TOPICAL_OINTMENT | CUTANEOUS | 0 refills | Status: DC | PRN
Start: 2020-08-28 — End: 2022-08-14

## 2020-08-28 NOTE — Progress Notes (Signed)
Post Partum Day 1 Subjective: Doing well, no complaints.  Tolerating regular diet, pain with PO meds, voiding and ambulating without difficulty.  No CP SOB Fever,Chills, N/V or leg pain; denies nipple or breast pain no HA change of vision, RUQ/epigastric pain  Objective: BP (!) 92/53 (BP Location: Left Arm) Comment: notify Dava RN  Pulse 68   Temp 98 F (36.7 C) (Oral)   Resp 18   Ht 5' 1.42" (1.56 m)   Wt 72.1 kg   LMP 11/16/2019   SpO2 98%   Breastfeeding Unknown   BMI 29.64 kg/m    Physical Exam:  General: NAD Breasts: soft/nontender CV: RRR Pulm: nl effort, CTABL Abdomen: soft, NT, BS x 4 Perineum: minimal edema, lacerations repair well approximated Lochia: small Uterine Fundus: fundus firm and 1 fb below umbilicus DVT Evaluation: no cords, ttp LEs   Recent Labs    08/27/20 0756 08/28/20 0630  HGB 9.9* 8.8*  HCT 28.3* 25.1*  WBC 10.3 8.7  PLT 148* 151    Assessment/Plan: 35 y.o. G2P1011 postpartum day # 1  - Continue routine PP care - Lactation consult prn.  - Acute blood loss anemia - hemodynamically stable and asymptomatic; start po ferrous sulfate BID with stool softeners  - Immunization status: all Imms up to date    Disposition: Does not desire Dc home today.     Francetta Found, CNM 08/28/2020  11:37 AM

## 2020-08-28 NOTE — Lactation Note (Signed)
This note was copied from a baby's chart. Lactation Consultation Note  Patient Name: Linda Dorsey MIWOE'H Date: 08/28/2020  Age:34 hours  [place] Student attempted to make visit. MOB sleeping FOB holding infant. [facility] Student will return when MOB is awake.       Linda Dorsey [name] 08/28/2020, 10:33 AM

## 2021-04-05 IMAGING — US US OB COMP +14 WK
1 series · 13 of 28 positions shown · non-contrast
Comparison: none

CLINICAL DATA: Pregnant, assess anatomy

EXAM:
OBSTETRICAL ULTRASOUND >14 WKS

[Series 1: us ob comp + 14 wk · 136 acquisitions, 13 frames shown]
[im 6/136]
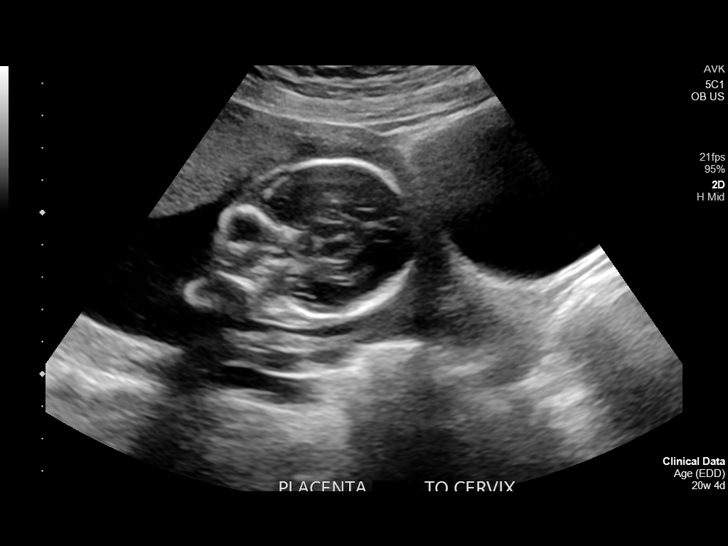
[im 16/136]
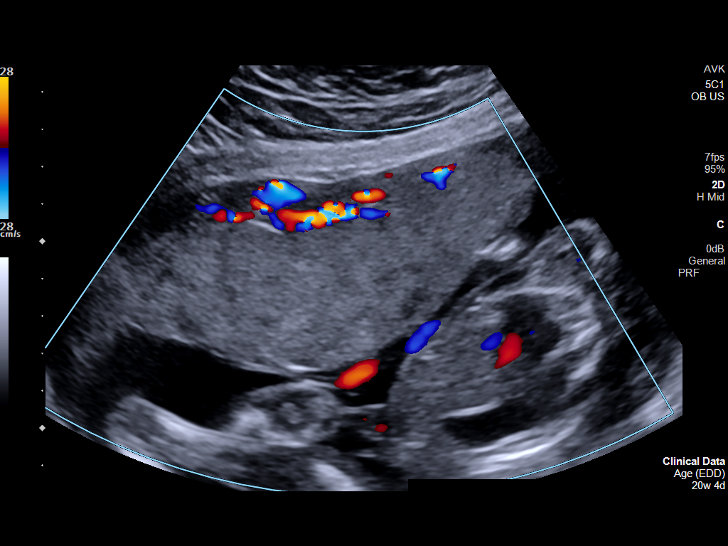
[im 26/136]
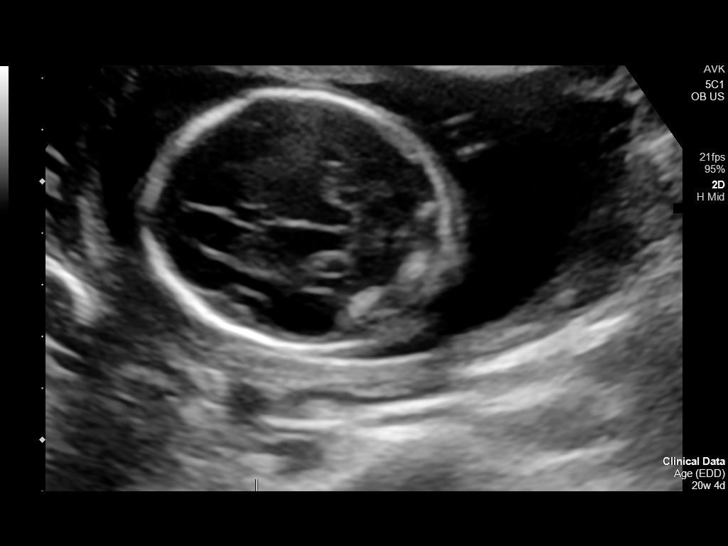
[im 36/136]
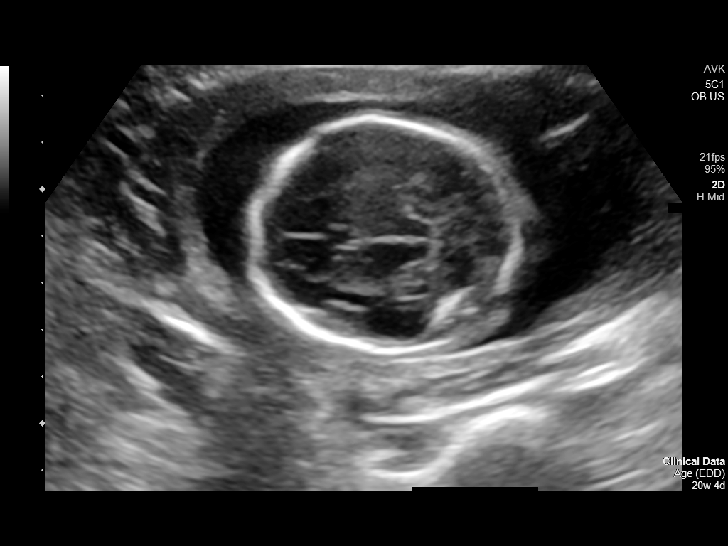
[im 46/136]
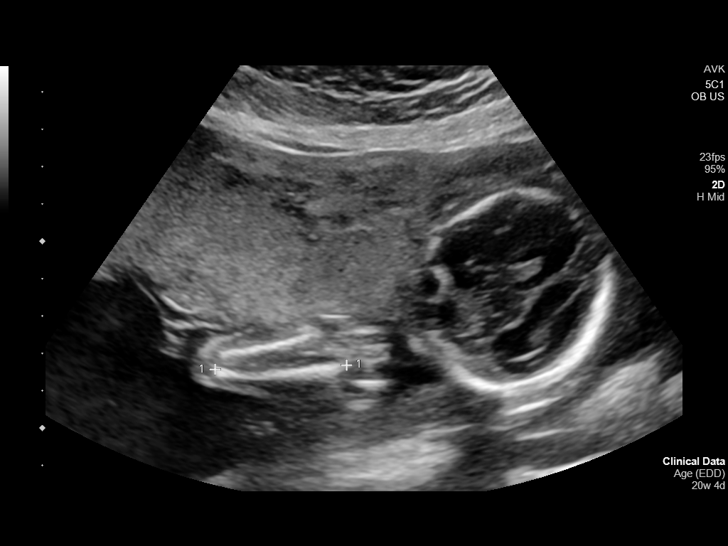
[im 56/136]
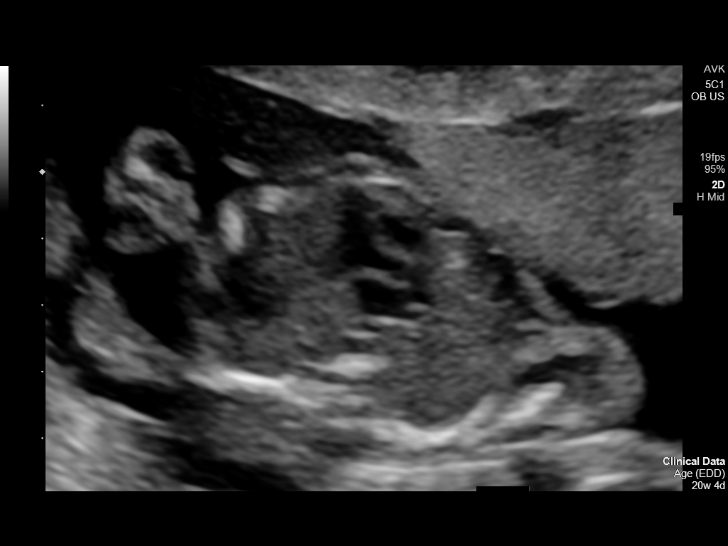
[im 71/136]
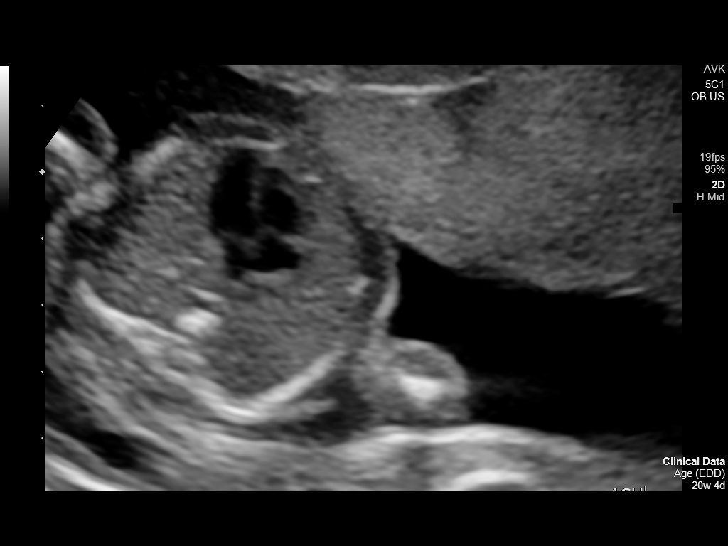
[im 81/136]
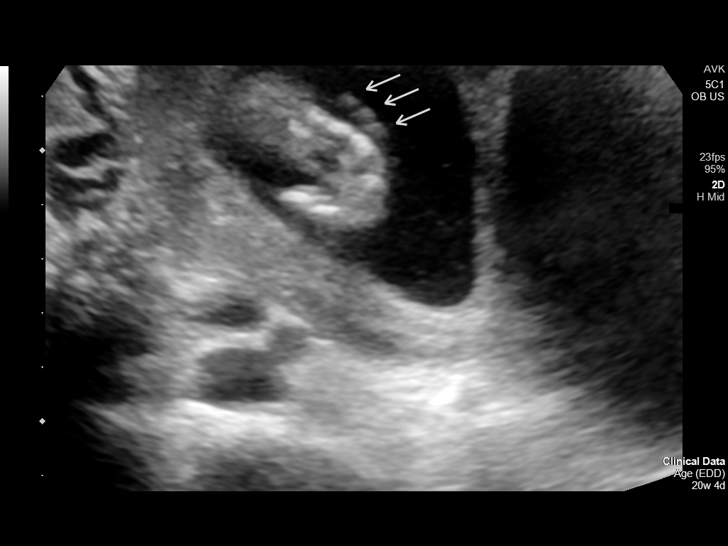
[im 91/136]
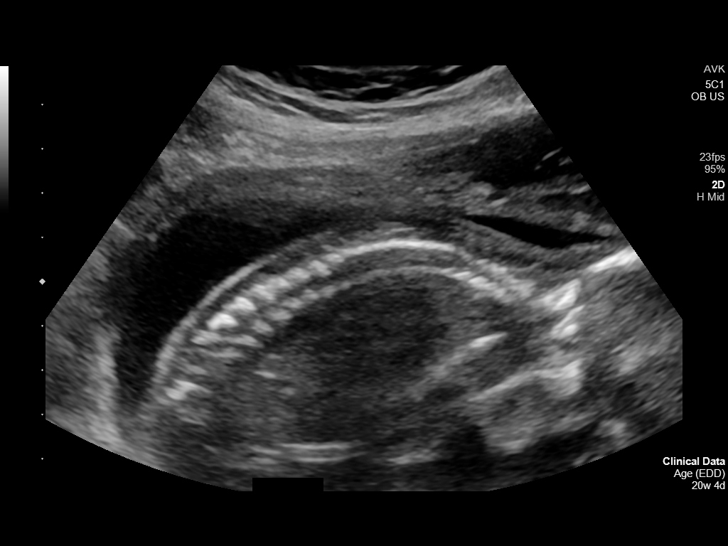
[im 101/136]
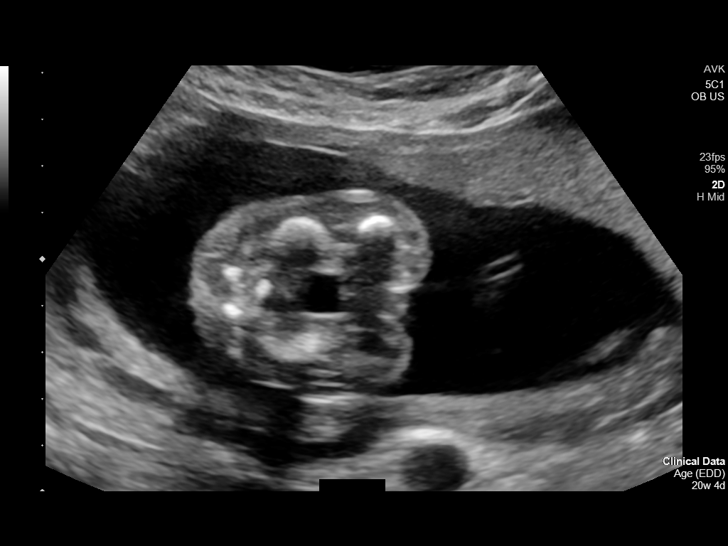
[im 111/136]
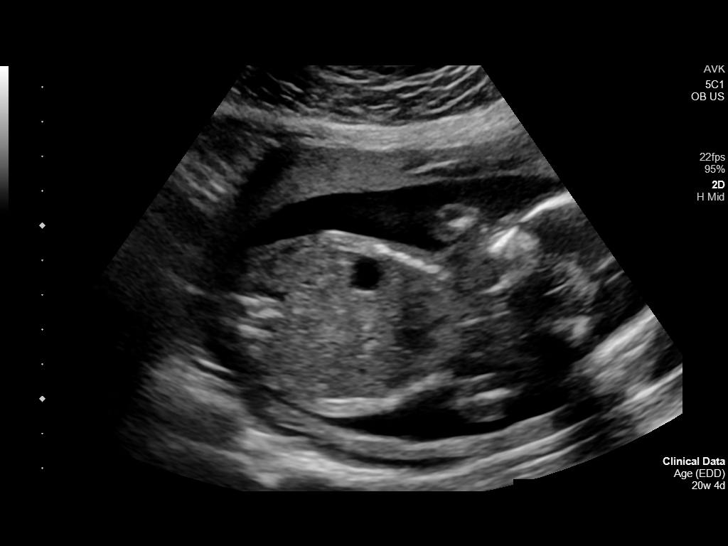
[im 121/136]
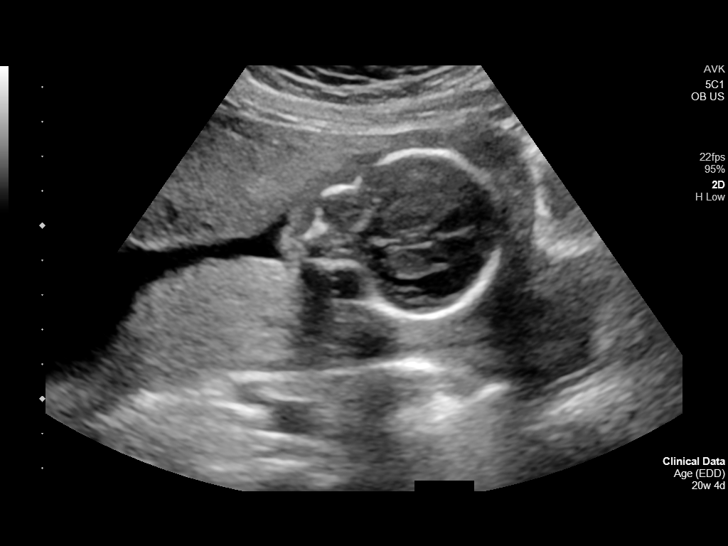
[im 131/136]
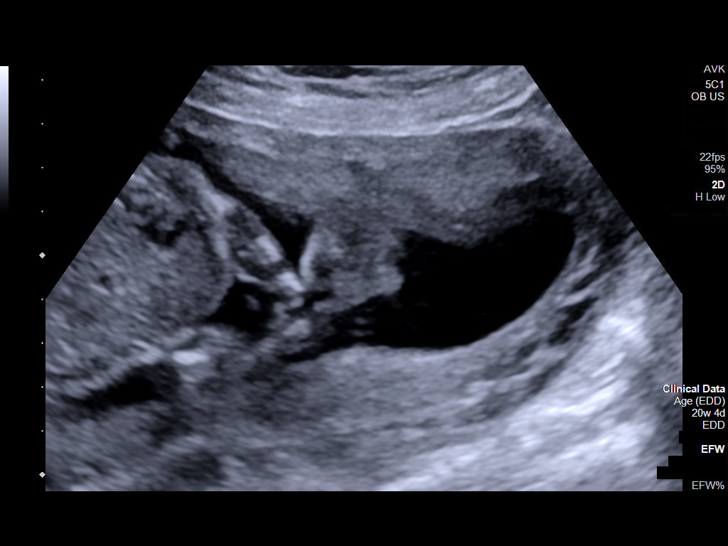

[13 of 28 positions shown; findings below may reference images not displayed]

FINDINGS: Number of Fetuses: 1

Heart Rate:  132 bpm

Movement: Yes

Presentation: Cephalic

Previa: No

Placental Location: Anterior

Amniotic Fluid (Subjective): Normal

Amniotic Fluid (Objective):

Vertical pocket = 4.7cm

FETAL BIOMETRY

BPD: 4.9cm 20w 5d

HC:   17.8cm 20w 2d

AC:   16.2cm 21w 2d

FL:   3.8cm 22w 0d

Current Mean GA: 21w 2d US EDC: 08/20/2020

Assigned GA:  20w 4d Assigned EDC: 08/25/2020

FETAL ANATOMY

Lateral Ventricles: Appears normal

Thalami/CSP: Appears normal

Posterior Fossa:  Appears normal

Nuchal Region: Not well visualized

Upper Lip: Not visualized

Spine: Appears normal

4 Chamber Heart on Left: Appears normal

LVOT: Appears normal

RVOT: Appears normal

Stomach on Left: Appears normal

3 Vessel Cord: Appears normal

Cord Insertion site: Appears normal

Kidneys: Appears normal

Bladder: Appears normal

Extremities: Appears normal

Sex: Male

Technically difficult due to: Fetal position

Maternal Findings:

Cervix:  3.2 cm
IMPRESSION: 1. Single live intrauterine pregnancy as above, estimated age 21
weeks and 2 days.
2. Suboptimal visualization of the nuchal region, face, and upper
lip. The remainder of the anatomic survey was unremarkable. Short
interval follow-up could be performed.

## 2022-02-20 ENCOUNTER — Other Ambulatory Visit: Payer: Self-pay | Admitting: Primary Care

## 2022-02-20 DIAGNOSIS — Z3482 Encounter for supervision of other normal pregnancy, second trimester: Secondary | ICD-10-CM

## 2022-03-01 ENCOUNTER — Ambulatory Visit
Admission: RE | Admit: 2022-03-01 | Discharge: 2022-03-01 | Disposition: A | Discharge status: Home / Self Care | Payer: Medicaid Other | Source: Ambulatory Visit | Attending: Primary Care | Admitting: Primary Care

## 2022-03-01 DIAGNOSIS — Z3689 Encounter for other specified antenatal screening: Secondary | ICD-10-CM | POA: Insufficient documentation

## 2022-03-01 DIAGNOSIS — Z3A16 16 weeks gestation of pregnancy: Secondary | ICD-10-CM | POA: Insufficient documentation

## 2022-03-01 DIAGNOSIS — Z3482 Encounter for supervision of other normal pregnancy, second trimester: Secondary | ICD-10-CM | POA: Insufficient documentation

## 2022-03-28 ENCOUNTER — Other Ambulatory Visit: Payer: Self-pay | Admitting: Primary Care

## 2022-03-28 DIAGNOSIS — Z3482 Encounter for supervision of other normal pregnancy, second trimester: Secondary | ICD-10-CM

## 2022-04-01 ENCOUNTER — Ambulatory Visit
Admission: RE | Admit: 2022-04-01 | Discharge: 2022-04-01 | Disposition: A | Discharge status: Home / Self Care | Payer: Self-pay | Source: Ambulatory Visit | Attending: Primary Care | Admitting: Primary Care

## 2022-04-01 DIAGNOSIS — Z3689 Encounter for other specified antenatal screening: Secondary | ICD-10-CM | POA: Insufficient documentation

## 2022-04-01 DIAGNOSIS — Z3482 Encounter for supervision of other normal pregnancy, second trimester: Secondary | ICD-10-CM | POA: Insufficient documentation

## 2022-04-01 DIAGNOSIS — Z3A20 20 weeks gestation of pregnancy: Secondary | ICD-10-CM | POA: Insufficient documentation

## 2022-06-10 NOTE — L&D Delivery Note (Signed)
Delivery Note  Date of delivery: 08/12/2022 Estimated Date of Delivery: 08/05/22 No LMP recorded. Patient is pregnant. EGA: [redacted]w[redacted]d Delivery Note At 9:54 PM a viable female was delivered via Vaginal, Spontaneous (Presentation: LOA).  APGAR: 8, 9; weight pending.  Placenta status: Spontaneous, Intact.  Cord: 3 vessels with the following complications: none.    First Stage: Labor onset: 2031 Augmentation : Induction with Cytotec Analgesia /Anesthesia intrapartum: IV Fentanyl and Epidural SROM at 1[name] presented to L&D for IOL for post-dates. She was given one dose of Cytotec, SROMs and then progressed on her own. Epidural placed for pain relief.   Second Stage: Complete dilation at 2128 Onset of pushing at 2139 FHR second stage Cat I and Cat II Delivery at 2154 on 08/12/2022  She progressed to complete and had a spontaneous vaginal birth of a live female over an intact perineum. The fetal head was delivered in OA position with restitution to LOA. Loose double nuchal cord, reduced over fetal head. Anterior then posterior shoulders delivered spontaneously with minimal assistance. Baby placed on mom's abdomen and attended to by transition RN. Cord clamped and cut after 1+ min by FOB.   Third Stage: Placenta delivered intact with 3VC at 2202 Placenta disposition: Routine disposal Uterine tone firm / bleeding min IV pitocin given for hemorrhage prophylaxis  Anesthesia: Epidural Episiotomy: None Lacerations:  1st degree perineal  Suture Repair: 2.0 vicryl Est. Blood Loss (mL):  [id number] Complications: nuchal cord  Mom to postpartum.  Baby to Couplet care / Skin to Skin.  Newborn: Birth Weight: pending  Apgar Scores: 8, 9 Feeding planned: Breast   Linda Dorsey CNM 08/12/2022 10:20 PM

## 2022-07-25 LAB — OB RESULTS CONSOLE GC/CHLAMYDIA
Chlamydia: NEGATIVE
Neisseria Gonorrhea: NEGATIVE

## 2022-07-25 LAB — OB RESULTS CONSOLE RUBELLA ANTIBODY, IGM: Rubella: IMMUNE

## 2022-07-25 LAB — OB RESULTS CONSOLE HIV ANTIBODY (ROUTINE TESTING): HIV: NONREACTIVE

## 2022-07-25 LAB — OB RESULTS CONSOLE GBS: GBS: NEGATIVE

## 2022-07-25 LAB — OB RESULTS CONSOLE VARICELLA ZOSTER ANTIBODY, IGG: Varicella: IMMUNE

## 2022-07-25 LAB — OB RESULTS CONSOLE RPR: RPR: NONREACTIVE

## 2022-07-25 LAB — OB RESULTS CONSOLE HEPATITIS B SURFACE ANTIGEN: Hepatitis B Surface Ag: NEGATIVE

## 2022-08-12 ENCOUNTER — Inpatient Hospital Stay
Admission: EM | Admit: 2022-08-12 | Discharge: 2022-08-12 | Disposition: A | Discharge status: Home / Self Care | Payer: Self-pay | Attending: Certified Nurse Midwife | Admitting: Certified Nurse Midwife

## 2022-08-12 ENCOUNTER — Encounter: Payer: Self-pay | Admitting: Obstetrics and Gynecology

## 2022-08-12 ENCOUNTER — Inpatient Hospital Stay: Payer: Medicaid Other | Admitting: Anesthesiology

## 2022-08-12 ENCOUNTER — Other Ambulatory Visit: Payer: Self-pay | Admitting: Certified Nurse Midwife

## 2022-08-12 ENCOUNTER — Inpatient Hospital Stay
Admission: RE | Admit: 2022-08-12 | Discharge: 2022-08-14 | DRG: 806 | Disposition: A | Discharge status: Home / Self Care | Payer: Medicaid Other | Attending: Obstetrics and Gynecology | Admitting: Obstetrics and Gynecology

## 2022-08-12 ENCOUNTER — Other Ambulatory Visit: Payer: Self-pay

## 2022-08-12 DIAGNOSIS — O480 Post-term pregnancy: Secondary | ICD-10-CM

## 2022-08-12 DIAGNOSIS — D62 Acute posthemorrhagic anemia: Secondary | ICD-10-CM | POA: Diagnosis not present

## 2022-08-12 DIAGNOSIS — O99283 Endocrine, nutritional and metabolic diseases complicating pregnancy, third trimester: Secondary | ICD-10-CM | POA: Insufficient documentation

## 2022-08-12 DIAGNOSIS — O9902 Anemia complicating childbirth: Secondary | ICD-10-CM | POA: Diagnosis present

## 2022-08-12 DIAGNOSIS — O99284 Endocrine, nutritional and metabolic diseases complicating childbirth: Secondary | ICD-10-CM | POA: Diagnosis present

## 2022-08-12 DIAGNOSIS — E039 Hypothyroidism, unspecified: Secondary | ICD-10-CM | POA: Diagnosis present

## 2022-08-12 DIAGNOSIS — Z3A41 41 weeks gestation of pregnancy: Secondary | ICD-10-CM | POA: Diagnosis not present

## 2022-08-12 DIAGNOSIS — Z79899 Other long term (current) drug therapy: Secondary | ICD-10-CM | POA: Insufficient documentation

## 2022-08-12 DIAGNOSIS — O0993 Supervision of high risk pregnancy, unspecified, third trimester: Secondary | ICD-10-CM | POA: Insufficient documentation

## 2022-08-12 HISTORY — DX: Hypothyroidism, unspecified: E03.9

## 2022-08-12 LAB — TYPE AND SCREEN
ABO/RH(D): A POS
Antibody Screen: NEGATIVE

## 2022-08-12 LAB — CBC
HCT: 33.2 % — ABNORMAL LOW (ref 36.0–46.0)
Hemoglobin: 11.6 g/dL — ABNORMAL LOW (ref 12.0–15.0)
MCH: 32.1 pg (ref 26.0–34.0)
MCHC: 34.9 g/dL (ref 30.0–36.0)
MCV: 92 fL (ref 80.0–100.0)
Platelets: 231 10*3/uL (ref 150–400)
RBC: 3.61 MIL/uL — ABNORMAL LOW (ref 3.87–5.11)
RDW: 13 % (ref 11.5–15.5)
WBC: 6.3 10*3/uL (ref 4.0–10.5)
nRBC: 0 % (ref 0.0–0.2)

## 2022-08-12 MED ORDER — OXYTOCIN-SODIUM CHLORIDE 30-0.9 UT/500ML-% IV SOLN
1.0000 m[IU]/min | INTRAVENOUS | Status: DC
  Filled 2022-08-12: qty 1000

## 2022-08-12 MED ORDER — SODIUM CHLORIDE 0.9 % IV SOLN
INTRAVENOUS | Status: DC | PRN
  Administered 2022-08-12: 5 mL via EPIDURAL

## 2022-08-12 MED ORDER — LEVOTHYROXINE SODIUM 50 MCG PO TABS
50.0000 ug | ORAL_TABLET | Freq: Every day | ORAL | Status: DC
  Administered 2022-08-13 – 2022-08-14 (×2): 50 ug via ORAL
  Filled 2022-08-12 (×2): qty 1

## 2022-08-12 MED ORDER — LIDOCAINE-EPINEPHRINE (PF) 1.5 %-1:200000 IJ SOLN
INTRAMUSCULAR | Status: DC | PRN
  Administered 2022-08-12: 3 mL via EPIDURAL

## 2022-08-12 MED ORDER — BENZOCAINE-MENTHOL 20-0.5 % EX AERO
1.0000 | INHALATION_SPRAY | CUTANEOUS | Status: DC | PRN
  Administered 2022-08-13: 1 via TOPICAL
  Filled 2022-08-12: qty 56

## 2022-08-12 MED ORDER — OXYCODONE-ACETAMINOPHEN 5-325 MG PO TABS: 2.0000 | ORAL_TABLET | ORAL | Status: DC | PRN

## 2022-08-12 MED ORDER — DIPHENHYDRAMINE HCL 50 MG/ML IJ SOLN: 12.5000 mg | INTRAMUSCULAR | Status: DC | PRN

## 2022-08-12 MED ORDER — WITCH HAZEL-GLYCERIN EX PADS
1.0000 | MEDICATED_PAD | CUTANEOUS | Status: DC | PRN
  Administered 2022-08-13: 1 via TOPICAL
  Filled 2022-08-12: qty 100

## 2022-08-12 MED ORDER — OXYCODONE HCL 5 MG PO TABS: 10.0000 mg | ORAL_TABLET | ORAL | Status: DC | PRN

## 2022-08-12 MED ORDER — LACTATED RINGERS IV SOLN: 500.0000 mL | INTRAVENOUS | Status: DC | PRN

## 2022-08-12 MED ORDER — DIBUCAINE (PERIANAL) 1 % EX OINT: 1.0000 | TOPICAL_OINTMENT | CUTANEOUS | Status: DC | PRN

## 2022-08-12 MED ORDER — FENTANYL-BUPIVACAINE-NACL 0.5-0.125-0.9 MG/250ML-% EP SOLN
10.0000 mL/h | EPIDURAL | Status: DC | PRN
  Administered 2022-08-12: 10 mL/h via EPIDURAL
  Filled 2022-08-12: qty 250

## 2022-08-12 MED ORDER — MISOPROSTOL 25 MCG QUARTER TABLET
25.0000 ug | ORAL_TABLET | ORAL | Status: DC | PRN
  Administered 2022-08-12: 25 ug via VAGINAL
  Filled 2022-08-12: qty 1

## 2022-08-12 MED ORDER — PHENYLEPHRINE 80 MCG/ML (10ML) SYRINGE FOR IV PUSH (FOR BLOOD PRESSURE SUPPORT): 80.0000 ug | PREFILLED_SYRINGE | INTRAVENOUS | Status: DC | PRN

## 2022-08-12 MED ORDER — LIDOCAINE HCL (PF) 1 % IJ SOLN
INTRAMUSCULAR | Status: DC | PRN
  Administered 2022-08-12: 3 mL via SUBCUTANEOUS

## 2022-08-12 MED ORDER — OXYTOCIN-SODIUM CHLORIDE 30-0.9 UT/500ML-% IV SOLN: 2.5000 [IU]/h | INTRAVENOUS | Status: DC

## 2022-08-12 MED ORDER — OXYCODONE HCL 5 MG PO TABS: 5.0000 mg | ORAL_TABLET | ORAL | Status: DC | PRN

## 2022-08-12 MED ORDER — ACETAMINOPHEN 325 MG PO TABS
650.0000 mg | ORAL_TABLET | ORAL | Status: DC | PRN
  Administered 2022-08-13 – 2022-08-14 (×2): 650 mg via ORAL
  Filled 2022-08-12 (×2): qty 2

## 2022-08-12 MED ORDER — SIMETHICONE 80 MG PO CHEW: 80.0000 mg | CHEWABLE_TABLET | ORAL | Status: DC | PRN

## 2022-08-12 MED ORDER — OXYTOCIN BOLUS FROM INFUSION
333.0000 mL | Freq: Once | INTRAVENOUS | Status: AC
  Administered 2022-08-12: 333 mL via INTRAVENOUS

## 2022-08-12 MED ORDER — PRENATAL MULTIVITAMIN CH
1.0000 | ORAL_TABLET | Freq: Every day | ORAL | Status: DC
  Administered 2022-08-13: 1 via ORAL
  Filled 2022-08-12: qty 1

## 2022-08-12 MED ORDER — ONDANSETRON HCL 4 MG PO TABS: 4.0000 mg | ORAL_TABLET | ORAL | Status: DC | PRN

## 2022-08-12 MED ORDER — ONDANSETRON HCL 4 MG/2ML IJ SOLN: 4.0000 mg | INTRAMUSCULAR | Status: DC | PRN

## 2022-08-12 MED ORDER — TERBUTALINE SULFATE 1 MG/ML IJ SOLN: 0.2500 mg | Freq: Once | INTRAMUSCULAR | Status: DC | PRN

## 2022-08-12 MED ORDER — ACETAMINOPHEN 325 MG PO TABS: 650.0000 mg | ORAL_TABLET | ORAL | Status: DC | PRN

## 2022-08-12 MED ORDER — SENNOSIDES-DOCUSATE SODIUM 8.6-50 MG PO TABS
2.0000 | ORAL_TABLET | Freq: Every day | ORAL | Status: DC
  Administered 2022-08-13 – 2022-08-14 (×2): 2 via ORAL
  Filled 2022-08-12 (×2): qty 2

## 2022-08-12 MED ORDER — FENTANYL CITRATE (PF) 100 MCG/2ML IJ SOLN
50.0000 ug | INTRAMUSCULAR | Status: DC | PRN
  Administered 2022-08-12: 50 ug via INTRAVENOUS
  Filled 2022-08-12: qty 2

## 2022-08-12 MED ORDER — DIPHENHYDRAMINE HCL 25 MG PO CAPS: 25.0000 mg | ORAL_CAPSULE | Freq: Four times a day (QID) | ORAL | Status: DC | PRN

## 2022-08-12 MED ORDER — EPHEDRINE 5 MG/ML INJ: 10.0000 mg | INTRAVENOUS | Status: DC | PRN

## 2022-08-12 MED ORDER — ONDANSETRON HCL 4 MG/2ML IJ SOLN: 4.0000 mg | Freq: Four times a day (QID) | INTRAMUSCULAR | Status: DC | PRN

## 2022-08-12 MED ORDER — OXYCODONE-ACETAMINOPHEN 5-325 MG PO TABS: 1.0000 | ORAL_TABLET | ORAL | Status: DC | PRN

## 2022-08-12 MED ORDER — COCONUT OIL OIL
1.0000 | TOPICAL_OIL | Status: DC | PRN
  Filled 2022-08-12: qty 7.5

## 2022-08-12 MED ORDER — TETANUS-DIPHTH-ACELL PERTUSSIS 5-2.5-18.5 LF-MCG/0.5 IM SUSY: 0.5000 mL | PREFILLED_SYRINGE | Freq: Once | INTRAMUSCULAR | Status: DC

## 2022-08-12 MED ORDER — IBUPROFEN 600 MG PO TABS
600.0000 mg | ORAL_TABLET | Freq: Four times a day (QID) | ORAL | Status: DC
  Administered 2022-08-13 – 2022-08-14 (×6): 600 mg via ORAL
  Filled 2022-08-12 (×6): qty 1

## 2022-08-12 MED ORDER — LACTATED RINGERS IV SOLN
500.0000 mL | Freq: Once | INTRAVENOUS | Status: AC
  Administered 2022-08-12: 500 mL via INTRAVENOUS

## 2022-08-12 MED ORDER — FERROUS SULFATE 325 (65 FE) MG PO TABS
325.0000 mg | ORAL_TABLET | Freq: Two times a day (BID) | ORAL | Status: DC
  Administered 2022-08-13 – 2022-08-14 (×3): 325 mg via ORAL
  Filled 2022-08-12 (×3): qty 1

## 2022-08-12 MED ORDER — SOD CITRATE-CITRIC ACID 500-334 MG/5ML PO SOLN: 30.0000 mL | ORAL | Status: DC | PRN

## 2022-08-12 MED ORDER — LIDOCAINE HCL (PF) 1 % IJ SOLN: 30.0000 mL | INTRAMUSCULAR | Status: DC | PRN

## 2022-08-12 MED ORDER — LACTATED RINGERS IV SOLN: INTRAVENOUS | Status: DC

## 2022-08-12 NOTE — Progress Notes (Signed)
OB History & Physical   History of Present Illness:  Chief Complaint:   HPI:  Linda Dorsey is a 37 y.o. G89P1011 female at 70w0ddated by LMP.  She presents to L&D for post-dates pregnancy  She reports:  -active fetal movement -no leakage of fluid -no vaginal bleeding -no contractions  Pregnancy Issues: 1. Anemia 2. H/o prolactinoma 3. Hypothyroidism    Maternal Medical History:   Past Medical History:  Diagnosis Date   Hypothyroidism    Thyroid disease    hypothyroid   Tumor    brain tumor, noncancerous    History reviewed. No pertinent surgical history.  No Known Allergies  Prior to Admission medications   Medication Sig Start Date End Date Taking? Authorizing Provider  acetaminophen (TYLENOL) 500 MG tablet Take 2 tablets (1,000 mg total) by mouth every 6 (six) hours as needed (for pain scale < 4). 08/28/20   McVey, RMurray Hodgkins CNM  benzocaine-Menthol (DERMOPLAST) 20-0.5 % AERO Apply 1 application topically as needed for irritation (perineal discomfort). 08/28/20   McVey, RMurray Hodgkins CNM  coconut oil OIL Apply 1 application topically as needed. 08/28/20   McVey, RMurray Hodgkins CNM  dibucaine (NUPERCAINAL) 1 % OINT Place 1 application rectally as needed for hemorrhoids. 08/28/20   McVey, RMurray Hodgkins CNM  docusate sodium (COLACE) 100 MG capsule Take 1 capsule (100 mg total) by mouth 2 (two) times daily. 08/28/20   McVey, RMurray Hodgkins CNM  ferrous sulfate 325 (65 FE) MG tablet Take 1 tablet (325 mg total) by mouth 2 (two) times daily with a meal. 08/28/20   McVey, RMurray Hodgkins CNM  ibuprofen (ADVIL) 600 MG tablet Take 1 tablet (600 mg total) by mouth every 6 (six) hours. 08/28/20   McVey, RMurray Hodgkins CNM  levothyroxine (SYNTHROID) 75 MCG tablet Take 1 tablet (75 mcg total) by mouth daily at 6 (six) AM. 08/29/20   McVey, RMurray Hodgkins CNM  Prenatal Vit-Fe Fumarate-FA (PRENATAL VITAMINS PO) Take 2 tablets by mouth. 2 gummies per day    [provider]  senna (SENOKOT) 8.6 MG  TABS tablet Take 1 tablet (8.6 mg total) by mouth at bedtime. 08/28/20   McVey, RMurray Hodgkins CNM  witch hazel-glycerin (TUCKS) pad Apply 1 application topically continuous. 08/28/20   McVey, RMurray Hodgkins CNM     [facility] care site: [facility]  Social History: She  reports that she has never smoked. She has never used smokeless tobacco. She reports that she does not currently use alcohol. She reports that she does not currently use drugs.  Family History: family history is not on file.   Review of Systems: A full review of systems was performed and negative except as noted in the HPI.    Physical Exam:  Vital Signs: BP 90/67 (BP Location: Left Arm)   Pulse 81   Temp 98.3 F (36.8 C) (Oral)   Resp 18   General:   alert and cooperative  Skin:  normal  Neurologic:    Alert & oriented x 3  Lungs:    Nl effort  Heart:   regular rate and rhythm  Abdomen:  soft, non-tender; bowel sounds normal; no masses,  no organomegaly  Extremities: : non-tender, symmetric, no edema bilaterally.       Pertinent Results:  Prenatal Labs: Blood type/Rh A pos  Antibody screen neg  Rubella Immune  Varicella Immune  RPR NR  HBsAg Neg  HIV NR  GC neg  Chlamydia neg  Genetic screening  1 hour GTT 161  3 hour GTT 11, 165, 104, 87  NL  GBS neg      Assessment:  Cabrielle Bilicki Delgado-Bautista is a 38 y.o. G49P1011 female at 67w0dwith post date pregnancy.   Plan:  1. Admit to Labor & Delivery; consents reviewed and obtained  2. Fetal Well being  - Fetal Tracing: Cat I - GBS neg - Presentation: vtx confirmed by sve   3. Routine OB: - Prenatal labs reviewed, as above - Rh pos - CBC & T&S on admit - Clear fluids, IVF  4. Induction of Labor -  Contractions by external toco in place -  [place] -  Plan for continuous fetal monitoring  -  Maternal pain control as desired: IVPM, nitrous, regional anesthesia - Anticipate vaginal delivery  5.  Post Partum Planning: - Infant feeding: Breastfeeding - Contraception: Nexplanon - Flu: 03/26/22 - Tdap: 05/21/22  JLinda Hedges CNM 08/12/2022 5:53 PM

## 2022-08-12 NOTE — Progress Notes (Signed)
Labor Progress Note  Linda Dorsey is a 37 y.o. G3P1011 at 65w0dby LMP admitted for induction of labor due to Post dates. Due date 08/05/22.  Subjective: RN reports pt is complete and she will start pushing with her  Objective: BP 104/64   Pulse 63   Temp 97.7 F (36.5 C) (Oral)   Resp 16   Ht '5\' 1"'$  (1.549 m)   Wt 69.9 kg   SpO2 98%   BMI 29.10 kg/m   Fetal Assessment: FHT:  FHR: 135 bpm, variability: moderate,  accelerations:  Present,  decelerations:  Present variables Category/reactivity:  Category I UC:   regular, every 1-4 minutes SVE:    Dilation: 10 cm  Effacement: 100%  Station:  -2  Consistency: soft  Position: anterior  Membrane status: SROM at 1858 Amniotic color: clear  Labs: Lab Results  Component Value Date   WBC 6.3 08/12/2022   HGB 11.6 (L) 08/12/2022   HCT 33.2 (L) 08/12/2022   MCV 92.0 08/12/2022   PLT 231 08/12/2022    Assessment / Plan: Induction of labor due to postterm,  progressing well on Cytotec  1414  2/60/-2 1415 Cytotec 270m PV 1815  2.5/80/-2 1858 SROM, never needed to start Pitocin 2031 5/100/-2   Labor: Progressing normally on her own since SROM Preeclampsia:   104/64 Fetal Wellbeing:  Category I and Category II Pain Control:  Epidural I/D:   Afebrile, GBS neg, SROM x2hrs Anticipated MOD:  NSVD  JeClydene LamingCNM 08/12/2022, 9:36 PM

## 2022-08-12 NOTE — Progress Notes (Signed)
Labor Progress Note  Linda Dorsey is a 37 y.o. G3P1011 at 64w0dby LMP admitted for induction of labor due to Post dates. Due date 08/05/22.  Subjective: Pt is comfortable with epidural. She reported UCs got very intense right before the epidural.  Objective: BP (!) 97/49   Pulse (!) 58   Temp 98.3 F (36.8 C) (Oral)   Resp 16   Ht '5\' 1"'$  (1.549 m)   Wt 69.9 kg   SpO2 97%   BMI 29.10 kg/m   Fetal Assessment: FHT:  FHR: 140 bpm, variability: moderate,  accelerations:  Present,  decelerations:  Absent Category/reactivity:  Category I UC:   regular, every 1-4 minutes SVE:    Dilation: 5 cm  Effacement: 100%  Station:  -2  Consistency: soft  Position: anterior  Membrane status: SROM at 1858 Amniotic color: clear  Labs: Lab Results  Component Value Date   WBC 6.3 08/12/2022   HGB 11.6 (L) 08/12/2022   HCT 33.2 (L) 08/12/2022   MCV 92.0 08/12/2022   PLT 231 08/12/2022    Assessment / Plan: Induction of labor due to postterm,  progressing well on Cytotec  1414  2/60/-2 1415 Cytotec 245m PV 1815  2.5/80/-2 1858 SROM, never needed to start Pitocin 2031 5/100/-2   Labor: Progressing normally on her own since SROM Preeclampsia:   97/49 Fetal Wellbeing:  Category I Pain Control:  Epidural I/D:   Afebrile, GBS neg, SROM x1hrs Anticipated MOD:  NSVD  JeClydene LamingCNM 08/12/2022, 8:35 PM

## 2022-08-12 NOTE — Discharge Summary (Signed)
Obstetrical Discharge Summary  Patient Name: Linda Dorsey DOB: April 03, 1986 MRN: [id number]  Date of Admission: 08/12/2022 Date of Delivery: 08/12/22 Delivered by: Linda Hedges, CNM  Date of Discharge: 08/14/2022  Primary OB: [facility] LMP:No LMP recorded. EDC Estimated Date of Delivery: 08/05/22 Gestational Age at Delivery: [redacted]w[redacted]d  Antepartum complications:  Anemia H/o prolactinoma Hypothyroidism   Admitting Diagnosis: Post-dates pregnancy [O48.0]  Secondary Diagnosis: Patient Active Problem List   Diagnosis Date Noted   Post-dates pregnancy 08/12/2022   Gestational diabetes mellitus (GDM) in third trimester 08/26/2020    Discharge Diagnosis: Term Pregnancy Delivered      Induction: Cytotec Complications: None Intrapartum complications/course: see delivery note Delivery Type: spontaneous vaginal delivery Anesthesia: epidural anesthesia Placenta: spontaneous To Pathology: No  Laceration: 1st degree Episiotomy: none Newborn Data: Live born female  Birth Weight:  8#4 APGAR: 825 9  Newborn Delivery   Birth date/time: 08/12/2022 21:54:00 Delivery type: Vaginal, Spontaneous      Postpartum Procedures: none  Edinburgh:     08/14/2022    8:35 AM 08/14/2022    5:58 AM 08/28/2020   12:06 PM  Edinburgh Postnatal Depression Scale Screening Tool  I have been able to laugh and see the funny side of things. 0 0 1  I have looked forward with enjoyment to things. 0  0  I have blamed myself unnecessarily when things went wrong. 1  0  I have been anxious or worried for no good reason. 1  2  I have felt scared or panicky for no good reason. 0  1  Things have been getting on top of me. 0  1  I have been so unhappy that I have had difficulty sleeping. 0  1  I have felt sad or miserable. 0  0  I have been so unhappy that I have been crying. 0  1  The thought of harming myself has occurred to me. 0  1  Edinburgh Postnatal Depression Scale Total 2  8     Post partum  course:  Patient had an uncomplicated postpartum course.  By time of discharge on PPD#2, her pain was controlled on oral pain medications; she had appropriate lochia and was ambulating, voiding without difficulty and tolerating regular diet.  She was deemed stable for discharge to home.    Discharge Physical Exam:  BP 100/71 (BP Location: Left Arm)   Pulse 88   Temp 98 F (36.7 C) (Oral)   Resp 18   Ht '5\' 1"'$  (1.549 m)   Wt 69.9 kg   SpO2 99%   Breastfeeding Unknown   BMI 29.10 kg/m   General: NAD CV: RRR Pulm: CTABL, nl effort ABD: s/nd/nt, fundus firm and below the umbilicus Lochia: moderate Perineum:minimal edema/laceration hemostatic, repair well approximated DVT Evaluation: LE non-ttp, no evidence of DVT on exam.  Hemoglobin  Date Value Ref Range Status  08/13/2022 10.1 (L) 12.0 - 15.0 g/dL Final   HCT  Date Value Ref Range Status  08/13/2022 29.4 (L) 36.0 - 46.0 % Final    Risk assessment for postpartum VTE and prophylactic treatment: Very high risk factors: None High risk factors: None Moderate risk factors: None  Postpartum VTE prophylaxis with LMWH not indicated  Disposition: stable, discharge to home. Baby Feeding: breast feeding Baby Disposition: home with mom  Rh Immune globulin indicated: No Rubella vaccine given: was not indicated Varivax vaccine given: was not indicated Flu vaccine given in AP setting: Yes  Tdap vaccine given in AP setting: Yes  Contraception:  Nexplanon  Prenatal Labs:  Blood type/Rh A pos  Antibody screen neg  Rubella Immune  Varicella Immune  RPR NR  HBsAg Neg  HIV NR  GC neg  Chlamydia neg  Genetic screening    1 hour GTT 161  3 hour GTT 11, 165, 104, 87  NL  GBS neg    Plan:  Linda Dorsey was discharged to home in good condition. Follow-up appointment with delivering provider in 6 weeks.  Discharge Medications: Allergies as of 08/14/2022   No Known Allergies      Medication List     STOP  taking these medications    dibucaine 1 % Oint Commonly known as: NUPERCAINAL   docusate sodium 100 MG capsule Commonly known as: COLACE   senna 8.6 MG Tabs tablet Commonly known as: SENOKOT       TAKE these medications    acetaminophen 325 MG tablet Commonly known as: Tylenol Take 2 tablets (650 mg total) by mouth every 4 (four) hours as needed (for pain scale < 4). What changed:  medication strength how much to take when to take this   benzocaine-Menthol 20-0.5 % Aero Commonly known as: DERMOPLAST Apply 1 Application topically as needed for irritation (perineal discomfort).   coconut oil Oil Apply 1 Application topically as needed.   diphenhydrAMINE 25 mg capsule Commonly known as: BENADRYL Take 1 capsule (25 mg total) by mouth every 6 (six) hours as needed for itching.   ferrous sulfate 325 (65 FE) MG tablet Take 1 tablet (325 mg total) by mouth 2 (two) times daily with a meal.   ibuprofen 600 MG tablet Commonly known as: ADVIL Take 1 tablet (600 mg total) by mouth every 6 (six) hours.   levothyroxine 50 MCG tablet Commonly known as: SYNTHROID Take 1 tablet (50 mcg total) by mouth daily at 6 (six) AM. Start taking on: August 15, 2022 What changed:  medication strength how much to take   PRENATAL VITAMINS PO Take 2 tablets by mouth. 2 gummies per day   senna-docusate 8.6-50 MG tablet Commonly known as: Senokot-S Take 2 tablets by mouth daily. Start taking on: August 15, 2022   simethicone 80 MG chewable tablet Commonly known as: MYLICON Chew 1 tablet (80 mg total) by mouth as needed for flatulence.   witch hazel-glycerin pad Commonly known as: TUCKS Apply 1 Application topically as needed for hemorrhoids. What changed:  when to take this reasons to take this         [place], [place] Follow up in 6 week(s).   Specialty: General Practice Why: postpartum appointment Contact information: [address]. [place] [address] [phone]                 Signed: Francetta Found, CNM 08/14/2022 12:24 PM

## 2022-08-12 NOTE — Progress Notes (Unsigned)
OB History & Physical   History of Present Illness:  Chief Complaint:   HPI:  Linda Dorsey is a 37 y.o. G75P1011 female at 81w0ddated by LMP.  She presents to L&D for post-dates pregnancy  She reports:  -active fetal movement -no leakage of fluid -no vaginal bleeding -no contractions  Pregnancy Issues: 1. Anemia 2. H/o prolactinoma 3. Hypothyroidism    Maternal Medical History:   Past Medical History:  Diagnosis Date   Thyroid disease    hypothyroid   Tumor    brain tumor, noncancerous    No past surgical history on file.  No Known Allergies  Prior to Admission medications   Medication Sig Start Date End Date Taking? Authorizing Provider  acetaminophen (TYLENOL) 500 MG tablet Take 2 tablets (1,000 mg total) by mouth every 6 (six) hours as needed (for pain scale < 4). 08/28/20   McVey, RMurray Hodgkins CNM  benzocaine-Menthol (DERMOPLAST) 20-0.5 % AERO Apply 1 application topically as needed for irritation (perineal discomfort). 08/28/20   McVey, RMurray Hodgkins CNM  coconut oil OIL Apply 1 application topically as needed. 08/28/20   McVey, RMurray Hodgkins CNM  dibucaine (NUPERCAINAL) 1 % OINT Place 1 application rectally as needed for hemorrhoids. 08/28/20   McVey, RMurray Hodgkins CNM  docusate sodium (COLACE) 100 MG capsule Take 1 capsule (100 mg total) by mouth 2 (two) times daily. 08/28/20   McVey, RMurray Hodgkins CNM  ferrous sulfate 325 (65 FE) MG tablet Take 1 tablet (325 mg total) by mouth 2 (two) times daily with a meal. 08/28/20   McVey, RMurray Hodgkins CNM  ibuprofen (ADVIL) 600 MG tablet Take 1 tablet (600 mg total) by mouth every 6 (six) hours. 08/28/20   McVey, RMurray Hodgkins CNM  levothyroxine (SYNTHROID) 75 MCG tablet Take 1 tablet (75 mcg total) by mouth daily at 6 (six) AM. 08/29/20   McVey, RMurray Hodgkins CNM  Prenatal Vit-Fe Fumarate-FA (PRENATAL VITAMINS PO) Take 2 tablets by mouth. 2 gummies per day    [provider]  senna (SENOKOT) 8.6 MG TABS tablet Take 1 tablet (8.6 mg  total) by mouth at bedtime. 08/28/20   McVey, RMurray Hodgkins CNM  witch hazel-glycerin (TUCKS) pad Apply 1 application topically continuous. 08/28/20   McVey, RMurray Hodgkins CNM     Prenatal care site: [facility]  Social History: She  reports that she has never smoked. She has never used smokeless tobacco. She reports that she does not currently use alcohol. She reports that she does not currently use drugs.  Family History: family history is not on file.   Review of Systems: A full review of systems was performed and negative except as noted in the HPI.    Physical Exam:  Vital Signs: There were no vitals taken for this visit.  General:   alert and cooperative  Skin:  normal  Neurologic:    Alert & oriented x 3  Lungs:    Nl effort  Heart:   regular rate and rhythm  Abdomen:  soft, non-tender; bowel sounds normal; no masses,  no organomegaly  Extremities: : non-tender, symmetric, no edema bilaterally.       Pertinent Results:  Prenatal Labs: Blood type/Rh A pos  Antibody screen neg  Rubella Immune  Varicella Immune  RPR NR  HBsAg Neg  HIV NR  GC neg  Chlamydia neg  Genetic screening   1 hour GTT 161  3 hour GTT 11, 165, 104, 87  NL  GBS neg  Assessment:  Linda Dorsey is a 37 y.o. G11P1011 female at Unknown with post date pregnancy.   Plan:  1. Admit to Labor & Delivery; consents reviewed and obtained  2. Fetal Well being  - Fetal Tracing: ___ - GBS neg - Presentation: vtx confirmed by sve   3. Routine OB: - Prenatal labs reviewed, as above - Rh pos - CBC & T&S on admit - Clear fluids, IVF  4. Induction of Labor -  Contractions by external toco in place -  Pelvis proven to [place] for induction with cytotec -  Plan for continuous fetal monitoring  -  Maternal pain control as desired: IVPM, nitrous, regional anesthesia - Anticipate vaginal delivery  5. Post Partum Planning: - Infant feeding: Breastfeeding - Contraception:  Nexplanon - Flu: 03/26/22 - Tdap: 05/21/22  Linda Hedges, CNM 08/12/2022 8:35 AM

## 2022-08-12 NOTE — Progress Notes (Signed)
Labor Progress Note  Linda Dorsey is a 37 y.o. G3P1011 at 40w0dby LMP admitted for induction of labor due to Post dates. Due date 08/05/22.  Subjective: Pt reports more pain now and desires IV pain medication.  Objective: BP 90/67 (BP Location: Left Arm)   Pulse 81   Temp 98.3 F (36.8 C) (Oral)   Resp 18   Fetal Assessment: FHT:  FHR: 140 bpm, variability: moderate,  accelerations:  Present,  decelerations:  Absent Category/reactivity:  Category I UC:   regular, every 1-4 minutes SVE:    Dilation: 2cm  Effacement: 80%  Station:  -2  Consistency: soft  Position: posterior  Membrane status: Intact Amniotic color: n/a  Labs: Lab Results  Component Value Date   WBC 6.3 08/12/2022   HGB 11.6 (L) 08/12/2022   HCT 33.2 (L) 08/12/2022   MCV 92.0 08/12/2022   PLT 231 08/12/2022    Assessment / Plan: Induction of labor due to postterm,  progressing well on Cytotec  1414  2/60/-2 1415 234m PV 1815  2.5/80/-2 Starting Pitocin   Labor: Progressing normally Preeclampsia:   90/67 Fetal Wellbeing:  Category I Pain Control:  Labor support without medications and IV pain meds I/D:   Afebrile, GBS neg, Intact Anticipated MOD:  NSVD  JeClydene LamingCNM 08/12/2022, 6:23 PM

## 2022-08-12 NOTE — Anesthesia Procedure Notes (Signed)
Epidural Patient location during procedure: OB  Staffing Anesthesiologist: Arita Miss, MD Performed: anesthesiologist   Preanesthetic Checklist Completed: patient identified, IV checked, site marked, risks and benefits discussed, surgical consent, monitors and equipment checked, pre-op evaluation and timeout performed  Epidural Patient position: sitting Prep: ChloraPrep Patient monitoring: heart rate, continuous pulse ox and blood pressure Approach: midline Location: L3-L4 Injection technique: LOR saline  Needle:  Needle type: Tuohy  Needle gauge: 17 G Needle length: 9 cm Needle insertion depth: 5 cm Catheter type: closed end flexible Catheter size: 19 Gauge Catheter at skin depth: 10 cm Test dose: negative and 1.5% lidocaine with Epi 1:200 K  Assessment Sensory level: T10 Events: blood not aspirated, no cerebrospinal fluid, injection not painful, no injection resistance, no paresthesia and negative IV test  Additional Notes First/one attempt Pt. Evaluated and documentation done after procedure finished. Patient identified. Risks/Benefits/Options discussed with patient including but not limited to bleeding, infection, nerve damage, paralysis, failed block, incomplete pain control, headache, blood pressure changes, nausea, vomiting, reactions to medication both or allergic, itching and postpartum back pain. Confirmed with bedside nurse the patient's most recent platelet count. Confirmed with patient that they are not currently taking any anticoagulation, have any bleeding history or any family history of bleeding disorders. Patient expressed understanding and wished to proceed. All questions were answered. Sterile technique was used throughout the entire procedure. Please see nursing notes for vital signs. Test dose was given through epidural catheter and negative prior to continuing to dose epidural or start infusion. Warning signs of high block given to the patient including  shortness of breath, tingling/numbness in hands, complete motor block, or any concerning symptoms with instructions to call for help. Patient was given instructions on fall risk and not to get out of bed. All questions and concerns addressed with instructions to call with any issues or inadequate analgesia.     Patient tolerated the insertion well without immediate complications.  Reason for block: procedure for painReason for block:procedure for pain

## 2022-08-12 NOTE — Anesthesia Preprocedure Evaluation (Signed)
Anesthesia Evaluation  Patient identified by MRN, date of birth, ID band Patient awake    Reviewed: Allergy & Precautions, NPO status , Patient's Chart, lab work & pertinent test results  History of Anesthesia Complications Negative for: history of anesthetic complications  Airway Mallampati: II  TM Distance: >3 FB Neck ROM: Full    Dental no notable dental hx. (+) Teeth Intact   Pulmonary neg pulmonary ROS, neg sleep apnea, neg COPD, Patient abstained from smoking.Not current smoker   Pulmonary exam normal breath sounds clear to auscultation       Cardiovascular Exercise Tolerance: Good METS(-) hypertension(-) CAD and (-) Past MI negative cardio ROS (-) dysrhythmias  Rhythm:Regular Rate:Normal - Systolic murmurs    Neuro/Psych negative neurological ROS  negative psych ROS   GI/Hepatic ,neg GERD  ,,(+)     (-) substance abuse    Endo/Other  neg diabetesHypothyroidism    Renal/GU negative Renal ROS     Musculoskeletal   Abdominal   Peds  Hematology   Anesthesia Other Findings Past Medical History: No date: Hypothyroidism No date: Thyroid disease     Comment:  hypothyroid No date: Tumor     Comment:  brain tumor, noncancerous  Reproductive/Obstetrics (+) Pregnancy                              Anesthesia Physical Anesthesia Plan  ASA: 2  Anesthesia Plan: Epidural   Post-op Pain Management:    Induction:   PONV Risk Score and Plan: 2 and Treatment may vary due to age or medical condition and Ondansetron  Airway Management Planned: Natural Airway  Additional Equipment:   Intra-op Plan:   Post-operative Plan:   Informed Consent: I have reviewed the patients History and Physical, chart, labs and discussed the procedure including the risks, benefits and alternatives for the proposed anesthesia with the patient or authorized representative who has indicated his/her  understanding and acceptance.       Plan Discussed with: Surgeon  Anesthesia Plan Comments: (Discussed R/B/A of neuraxial anesthesia technique with patient: - rare risks of spinal/epidural hematoma, nerve damage, infection - Risk of PDPH - Risk of itching - Risk of nausea and vomiting - Risk of poor block necessitating replacement of epidural. - Risk of allergic reactions. Patient voiced understanding.)         Anesthesia Quick Evaluation

## 2022-08-13 LAB — CBC
HCT: 29.4 % — ABNORMAL LOW (ref 36.0–46.0)
Hemoglobin: 10.1 g/dL — ABNORMAL LOW (ref 12.0–15.0)
MCH: 31.9 pg (ref 26.0–34.0)
MCHC: 34.4 g/dL (ref 30.0–36.0)
MCV: 92.7 fL (ref 80.0–100.0)
Platelets: 186 10*3/uL (ref 150–400)
RBC: 3.17 MIL/uL — ABNORMAL LOW (ref 3.87–5.11)
RDW: 13.1 % (ref 11.5–15.5)
WBC: 7.5 10*3/uL (ref 4.0–10.5)
nRBC: 0 % (ref 0.0–0.2)

## 2022-08-13 LAB — RPR: RPR Ser Ql: NONREACTIVE

## 2022-08-13 NOTE — Lactation Note (Addendum)
This note was copied from a baby's chart. Lactation Consultation Note  Patient Name: Girl Meher Paullin [id number] Date: 08/13/2022 Age:36 hours Reason for consult: Initial assessment;Term   Maternal Data This is mom's 2nd baby, SVD. Mom with history of hypothyroidism.  Baby was cueing in the bassinet when [facility] entered the room assisted mom with breastfeeding.  Has patient been taught Hand Expression?: Yes Does the patient have breastfeeding experience prior to this delivery?: Yes How long did the patient breastfeed?: Mom reports she initially had latch difficulty for a few weeks and supplemented baby with formula. After 2 months she went on and breastfed for 1.5 years and  also used formula as needed.  Feeding Mother's Current Feeding Choice: Breast Milk and Formula Confirmed mom's feeding plan and she is going to breastfeed and offer formula as needed.  Provided mom with tips and strategies to maximize position and latch technique. Baby with bottom lip rolled inward. Mom with some discomfort that improved after mom instructed on correcting latch. Baby with multiple audible swallows and was content after breastfeeding.  LATCH Score Latch: Grasps breast easily, tongue down, lips flanged, rhythmical sucking.  Audible Swallowing: Spontaneous and intermittent  Type of Nipple: Everted at rest and after stimulation  Comfort (Breast/Nipple): Filling, red/small blisters or bruises, mild/mod discomfort (Baby with lower lip rolled inward and mom reporting some nipple discomfort. Instructed mom on latch correction. Mom reports decrease in pain once latch corrected.)  Hold (Positioning): Assistance needed to correctly position infant at breast and maintain latch.  LATCH Score: 8     Interventions Interventions: Breast feeding basics reviewed;Assisted with latch;Breast massage;Breast compression;Adjust position;Support pillows;Education Recommended mom offer baby to breastfeed first  before offering formula supplement.  Discharge Pump: Manual  Consult Status Consult Status: Follow-up Date: 08/14/22 Follow-up type: In-patient All discussed with use of Spanish Interpreter via [place]. Update provided to care nurse.  Jonna Berman Grainger 08/13/2022, 1:08 PM

## 2022-08-13 NOTE — Progress Notes (Signed)
Post Partum Day 1 Subjective: Doing well, no complaints.  Tolerating regular diet, pain with PO meds, voiding and ambulating without difficulty.  No CP SOB Fever,Chills, N/V or leg pain; denies nipple or breast pain, no HA change of vision, RUQ/epigastric pain  Objective: BP (!) 91/57 (BP Location: Right Arm)   Pulse 72   Temp 98.7 F (37.1 C) (Oral)   Resp 18   Ht '5\' 1"'$  (1.549 m)   Wt 69.9 kg   SpO2 98%   Breastfeeding Unknown   BMI 29.10 kg/m    Physical Exam:  General: NAD Breasts: soft/nontender CV: RRR Pulm: nl effort, CTABL Abdomen: soft, NT, BS x 4 Perineum: minimal edema, repair well approximated Lochia: moderate Uterine Fundus: fundus firm and 1 fb below umbilicus DVT Evaluation: no cords, ttp LEs   Recent Labs    08/12/22 1355 08/13/22 0618  HGB 11.6* 10.1*  HCT 33.2* 29.4*  WBC 6.3 7.5  PLT 231 186    Assessment/Plan: 37 y.o. [id number] postpartum day # 1  - Continue routine PP care - Lactation consult PRN - Discussed contraceptive options including implant, IUDs hormonal and non-hormonal, injection, pills/ring/patch, condoms, and NFP.  - Acute blood loss anemia - hemodynamically stable and asymptomatic; start po ferrous sulfate BID with stool softeners  - Immunization status: all Imms up to date  Disposition: Does not desire Dc home today.   Gertie Fey, CNM 08/13/2022 8:32 AM

## 2022-08-14 MED ORDER — DIPHENHYDRAMINE HCL 25 MG PO CAPS: 25.0000 mg | ORAL_CAPSULE | Freq: Four times a day (QID) | ORAL | 0 refills | Status: AC | PRN

## 2022-08-14 MED ORDER — COCONUT OIL OIL: 1.0000 | TOPICAL_OIL | 0 refills | Status: AC | PRN

## 2022-08-14 MED ORDER — IBUPROFEN 600 MG PO TABS: 600.0000 mg | ORAL_TABLET | Freq: Four times a day (QID) | ORAL | 0 refills | Status: AC

## 2022-08-14 MED ORDER — SENNOSIDES-DOCUSATE SODIUM 8.6-50 MG PO TABS: 2.0000 | ORAL_TABLET | Freq: Every day | ORAL | 0 refills | Status: AC

## 2022-08-14 MED ORDER — SIMETHICONE 80 MG PO CHEW: 80.0000 mg | CHEWABLE_TABLET | ORAL | 0 refills | Status: AC | PRN

## 2022-08-14 MED ORDER — BENZOCAINE-MENTHOL 20-0.5 % EX AERO: 1.0000 | INHALATION_SPRAY | CUTANEOUS | Status: AC | PRN

## 2022-08-14 MED ORDER — WITCH HAZEL-GLYCERIN EX PADS: 1.0000 | MEDICATED_PAD | CUTANEOUS | 12 refills | Status: AC | PRN

## 2022-08-14 MED ORDER — ACETAMINOPHEN 325 MG PO TABS: 650.0000 mg | ORAL_TABLET | ORAL | Status: AC | PRN

## 2022-08-14 NOTE — Progress Notes (Signed)
Patient discharged. Discharge instructions given. Patient verbalizes understanding. Transported by axillary.

## 2022-08-14 NOTE — Anesthesia Postprocedure Evaluation (Signed)
Anesthesia Post Note  Patient: Linda Dorsey  Procedure(s) Performed: AN AD HOC LABOR EPIDURAL  Patient location during evaluation: Mother Baby Anesthesia Type: Epidural Level of consciousness: awake and alert Pain management: pain level controlled Vital Signs Assessment: post-procedure vital signs reviewed and stable Respiratory status: spontaneous breathing, nonlabored ventilation and respiratory function stable Cardiovascular status: stable Postop Assessment: no headache, no backache and epidural receding Anesthetic complications: no   No notable events documented.   Last Vitals:  Vitals:   08/13/22 1604 08/13/22 2256  BP: (!) 90/59 105/67  Pulse:  74  Resp: 18 18  Temp: 36.9 C 36.7 C  SpO2:  100%    Last Pain:  Vitals:   08/13/22 2256  TempSrc: Oral  PainSc:                  Arita Miss

## 2022-08-14 NOTE — Lactation Note (Signed)
This note was copied from a baby's chart. Lactation Consultation Note  Patient Name: Linda Dorsey Date: 08/14/2022 Age:36 hours Reason for consult: Follow-up assessment;Term   Maternal Data This is mom's 2nd baby, SVD. Mom with history of hypothyroidism, anemia, prolactinoma.   Has patient been taught Hand Expression?: Yes Does the patient have breastfeeding experience prior to this delivery?: Yes How long did the patient breastfeed?: Mom reports she initially had latch difficulty for a few weeks and supplemented baby with formula. After 2 months she went on and breastfed for 1.5 years and  also used formula as needed.  Feeding Mother's Current Feeding Choice: Breast Milk and Formula Nipple Type: Slow - flow  Baby showing feeding cues. Per parents baby had a bottle 1 hour ago. Mom offered baby to breast.Baby latched and suckled for 5 minutes became fussy at the breast detaching and crying.  Baby would not calm to relatch. Family member bottle fed baby formula. Discussed observing baby for feeding cues and offering baby to the breast first before offering the formula bottle. Recommended mom massage her breast while feeding to encourage milk flow and keep baby interested. Today, mom's breasts are filling. Mom noted to have small scab on tip of left nipple. Provided mom with coconut oil and instructed mom to reapply after each breastfeeding until nipple is healed. Reviewed with mom how to check the baby has a good latch. Yesterday baby had bottom lip rolled inward. Mom was able to correct latch yesterday.  LATCH Score Latch: Grasps breast easily, tongue down, lips flanged, rhythmical sucking.  Audible Swallowing: Spontaneous and intermittent  Type of Nipple: Everted at rest and after stimulation  Comfort (Breast/Nipple): Filling, red/small blisters or bruises, mild/mod discomfort  Hold (Positioning): No assistance needed to correctly position infant at breast.  LATCH  Score: 9   Lactation Tools Discussed/Used Tools: Coconut oil;Bottle  Interventions Interventions: Breast feeding basics reviewed;Breast massage;Support pillows;Coconut oil;Education  Discharge Discharge Education: Engorgement and breast care;Warning signs for feeding baby;Outpatient recommendation Pump: Manual  Consult Status All dialogue with assistance of Spanish Interpreter via [facility]. Consult Status: Complete Date: 08/14/22 Follow-up type: In-patient  Update provided to care nurse.  Jonna Shavonna Corella 08/14/2022, 12:12 PM
